# Patient Record
Sex: Male | Born: 1945 | Race: White | Hispanic: No | Marital: Married | Smoking: Never smoker
Health system: Southern US, Community
[De-identification: ages and names within clinical notes are randomized; demographics above are authoritative.]

## PROBLEM LIST (undated history)

## (undated) DIAGNOSIS — H3552 Pigmentary retinal dystrophy: Secondary | ICD-10-CM

## (undated) DIAGNOSIS — H9190 Unspecified hearing loss, unspecified ear: Secondary | ICD-10-CM

## (undated) DIAGNOSIS — I10 Essential (primary) hypertension: Secondary | ICD-10-CM

## (undated) HISTORY — DX: Essential (primary) hypertension: I10

## (undated) HISTORY — PX: COLONOSCOPY: SHX174

## (undated) HISTORY — PX: EYE SURGERY: SHX253

## (undated) HISTORY — PX: TONSILLECTOMY: SUR1361

## (undated) HISTORY — DX: Unspecified hearing loss, unspecified ear: H91.90

## (undated) HISTORY — PX: BRAIN SURGERY: SHX531

## (undated) HISTORY — DX: Pigmentary retinal dystrophy: H35.52

---

## 2008-05-06 ENCOUNTER — Ambulatory Visit: Payer: Self-pay | Admitting: Gastroenterology

## 2008-12-09 ENCOUNTER — Ambulatory Visit: Payer: Self-pay | Admitting: Internal Medicine

## 2011-10-13 ENCOUNTER — Telehealth: Payer: Self-pay | Admitting: *Deleted

## 2011-10-13 ENCOUNTER — Encounter: Payer: Self-pay | Admitting: Vascular Surgery

## 2011-10-13 NOTE — Telephone Encounter (Signed)
Spoke with Mrs. Hoffmaster regarding her husband's medical history as he has decreased hearing in preparation for office visit with Dr. Hart Rochester on 10-17-2011.  Rankin, Neena Rhymes

## 2011-10-14 ENCOUNTER — Other Ambulatory Visit: Payer: Self-pay

## 2011-10-14 DIAGNOSIS — I83893 Varicose veins of bilateral lower extremities with other complications: Secondary | ICD-10-CM

## 2011-10-17 ENCOUNTER — Encounter: Payer: Self-pay | Admitting: Vascular Surgery

## 2011-10-17 ENCOUNTER — Other Ambulatory Visit (INDEPENDENT_AMBULATORY_CARE_PROVIDER_SITE_OTHER): Payer: BC Managed Care – PPO

## 2011-10-17 ENCOUNTER — Ambulatory Visit (INDEPENDENT_AMBULATORY_CARE_PROVIDER_SITE_OTHER): Payer: BC Managed Care – PPO | Admitting: Vascular Surgery

## 2011-10-17 VITALS — BP 138/88 | HR 69 | Resp 16 | Ht 73.0 in | Wt 209.4 lb

## 2011-10-17 DIAGNOSIS — I83893 Varicose veins of bilateral lower extremities with other complications: Secondary | ICD-10-CM

## 2011-10-17 NOTE — Progress Notes (Addendum)
Subjective:     Patient ID: Tyrone Ortega, male   DOB: [birth year], 65 y.o.   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 These have become increasingly uncomfortable with aching throbbing and burning. He has had chronic swelling in the right ankle. He does not wear elastic stockings. He does not elevate his leg on a regular basis. He has no history of stasis ulcers, DVT, thrombophlebitis, or bleeding.His symptoms are worsening however.  Review of Systems denies chest pain dyspnea on exertion PND orthopnea or hemoptysis. Does complain of dizziness, headaches, arthritis, joint pain, urinary frequency. All other systems are negative and a complete review of systems  Past Medical History  Diagnosis Date  . Decreased hearing   . RP (retinitis pigmentosa)   . Hypertension     History  Substance Use Topics  . Smoking status: Never Smoker   . Smokeless tobacco: Not on file  . Alcohol Use: No    Family History  Problem Relation Age of Onset  . Kidney disease Mother   . Heart attack Father   . Heart disease Father   . Cancer Sister     breast  . Other Brother     mental retardation    No Known Allergies  Current outpatient prescriptions:latanoprost (XALATAN) 0.005 % ophthalmic solution, Place 1 drop into both eyes at bedtime. , Disp: , Rfl: ;  LISINOPRIL PO, Take 40 mg by mouth daily. , Disp: , Rfl:   BP 138/88  Pulse 69  Resp 16  Ht 6\' 1"  (1.854 m)  Wt 209 lb 6.4 oz (94.983 kg)  BMI 27.63 kg/m2  Body mass index is 27.63 kg/(m^2).          Objective:   Physical Exam general he is a well-developed well-nourished male in no apparent distress Blood pressure 138/88 heart rate 69 respirations 16 HEENT normal for age Chest no rhonchi or wheezing Cardiovascular regular rhythm no murmurs Abdomen soft nontender no masses Neurologic normal Musculoskeletal free of major deformities Skin no rashes. He does have  bulging varicosities which are quite large in the right posterior and medial calf over the great and small saphenous system. There is 1-2+ edema in the right ankle. There is some early hyperpigmentation in the right ankle. He has 3+ femoral popliteal and dorsalis pedis pulses palpable bilaterally.  Today I ordered a venous duplex exam which I reviewed and interpreted. The right great saphenous vein has reflux particularly in the mid calf to the thigh area with a large caliber vein. As communicates with the varicosities in the right medial calf. There is a large right small saphenous vein which has reflux at the junction. It does not appear to be a straight line vein however down to the mid calf area on my independent exam. There is also severe reflux in the right popliteal vein in the deep system but no DVT     Assessment:    severe venous insufficiency right leg with gross reflux in right GSV and small saphenous vein.    Plan:    #1 lumbar elastic compression-20-30 mm gradient #2 elevate legs when possible #3 ibuprofen on a daily basis #4 return in 3 months if no improvement I believe we should proceed with laser ablation of right great saphenous vein with multiple stab phlebectomy. Will follow right small saphenous vein which is a quite large vein. This may need open surgical treatment.

## 2011-10-18 NOTE — Procedures (Unsigned)
LOWER EXTREMITY VENOUS REFLUX EXAM  INDICATION:  Lower extremity varicose veins with pain and inflammation.  EXAM:  Using color-flow imaging and pulse Doppler spectral analysis, the bilateral common femoral, superficial femoral, popliteal, posterior tibial, greater and lesser saphenous veins are evaluated.  There is evidence suggesting deep venous insufficiency in the bilateral common femoral veins and the right popliteal vein.  The bilateral saphenofemoral junction is competent. The bilateral GSV's are not competent with Reflux of >569milliseconds with the caliber as described below.   The right proximal small saphenous vein demonstrates incompetency, measuring 1.1 cm at the saphenopopliteal junction and 1.45 cm in the proximal calf.  GSV Diameter (used if found to be incompetent only)                                           Right    Left Proximal Greater Saphenous Vein           0.68 cm  1.14 cm Proximal-to-mid-thigh                     0.62 cm  0.68 cm Mid thigh                                 0.52 cm  0.55 cm Mid-distal thigh                          cm       cm Distal thigh                              0.49 cm  0.49 cm Knee                                      0.83 cm  0.52 cm  PROXIMAL CALF RIGHT:  0.71 cm.  PROXIMAL CALF LEFT:  0.41 cm  IMPRESSION: 1. Bilaterally, the great saphenous veins are not competent with     reflux >555milliseconds. 2. The bilateral great saphenous vein is not tortuous. 3. The deep venous system is not competent with reflux >500     milliseconds. 4. The right small saphenous vein is not competent with reflux >500     milliseconds. 5. Bilaterally, there is a complex moderately sized Baker's cyst.  ___________________________________________ Quita Skye. Hart Rochester, M.D.  CI/MEDQ  D:  10/17/2011  T:  10/17/2011  Job:  [id number]

## 2011-12-12 ENCOUNTER — Ambulatory Visit: Payer: Self-pay | Admitting: Internal Medicine

## 2011-12-12 ENCOUNTER — Emergency Department: Payer: Self-pay | Admitting: *Deleted

## 2011-12-12 ENCOUNTER — Ambulatory Visit: Payer: Self-pay | Admitting: Oncology

## 2011-12-13 ENCOUNTER — Ambulatory Visit: Payer: Self-pay | Admitting: Oncology

## 2011-12-27 ENCOUNTER — Ambulatory Visit: Payer: Self-pay | Admitting: Oncology

## 2012-01-17 ENCOUNTER — Ambulatory Visit: Payer: BC Managed Care – PPO | Admitting: Vascular Surgery

## 2012-01-20 DIAGNOSIS — D1802 Hemangioma of intracranial structures: Secondary | ICD-10-CM

## 2012-01-20 DIAGNOSIS — I10 Essential (primary) hypertension: Secondary | ICD-10-CM

## 2012-05-28 ENCOUNTER — Ambulatory Visit: Payer: BC Managed Care – PPO | Admitting: Family Medicine

## 2012-05-29 IMAGING — CT CT HEAD WITHOUT AND WITH CONTRAST
1 of 2 series · 13 of 30 positions shown, 17 images · non-contrast
Comparison: none

REASON FOR EXAM: hx of falls dizziness headaches  eval mass or tumor
COMMENTS:

PROCEDURE:     CT  - CT HEAD W/WO  - December 12, 2011  [DATE]
RESULT:     Comparison:  None
INDICATION: Headaches.
TECHNIQUE: Multiple axial images were obtained prior to following 50 mL of
6sovue-7OO IV contrast.

[Series 2: without · axial · non-contrast · 0.40mm/px · z∈[-189,-59]mm · 13 of 32 slices shown, 17 images]
[im 3/32  brain]
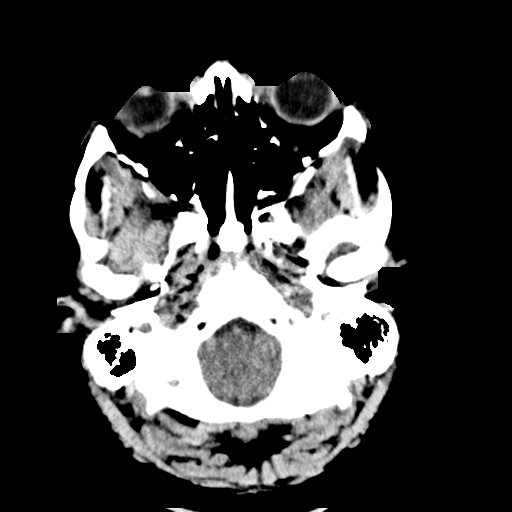
[im 3/32  bone]
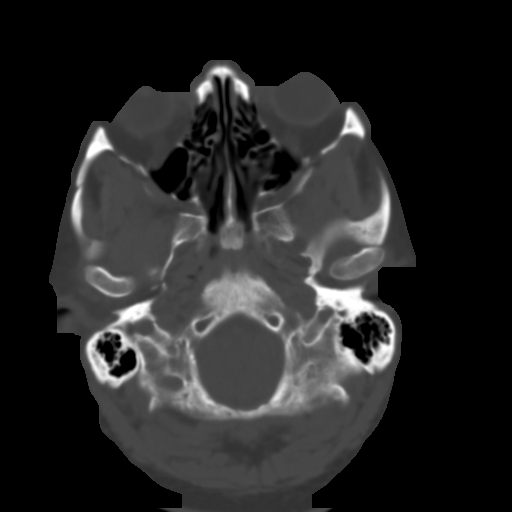
[im 5/32  brain]
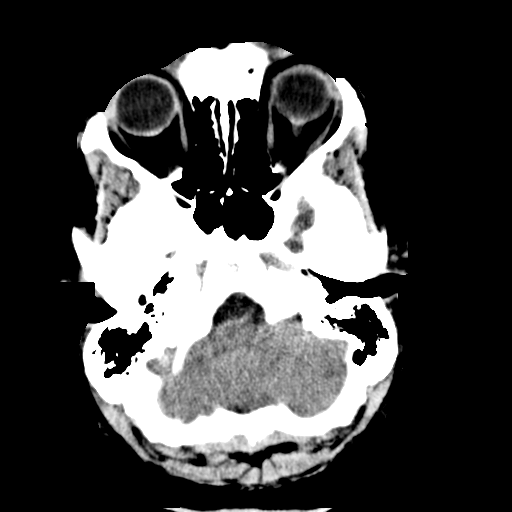
[im 7/32  brain]
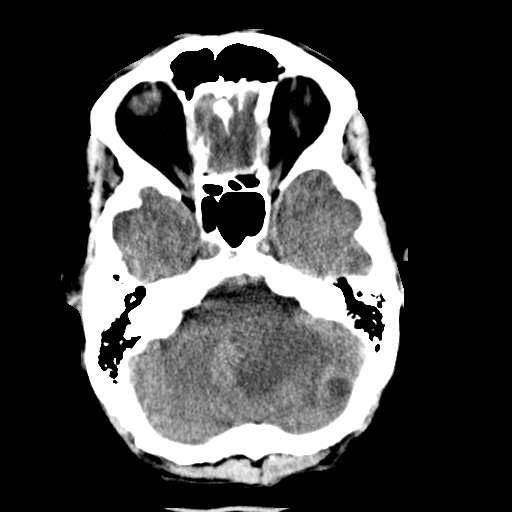
[im 9/32  brain]
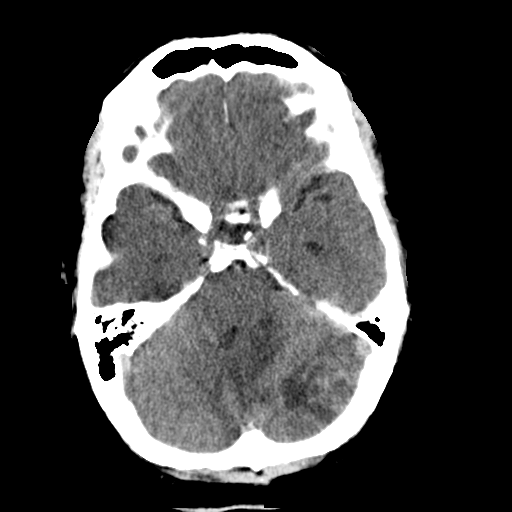
[im 12/32  brain]
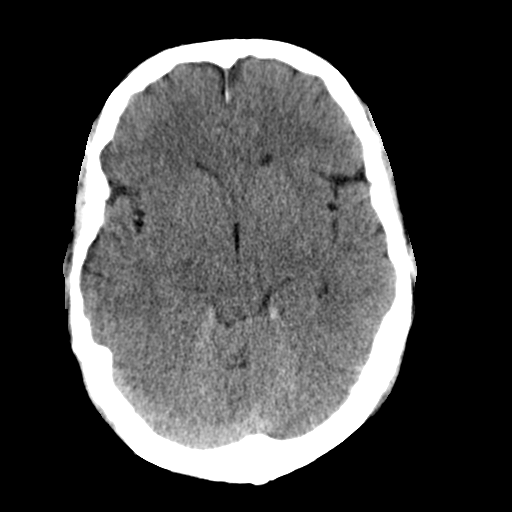
[im 12/32  bone]
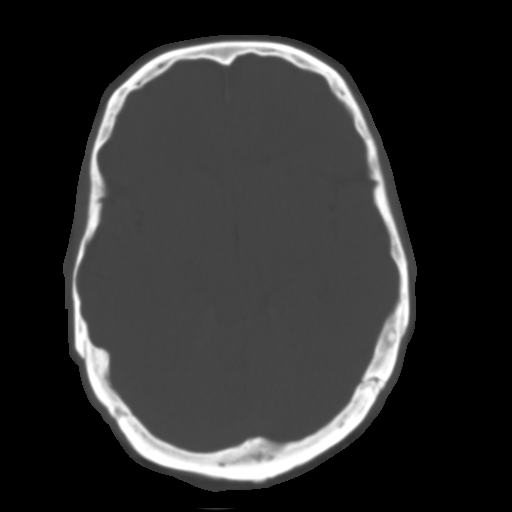
[im 14/32  brain]
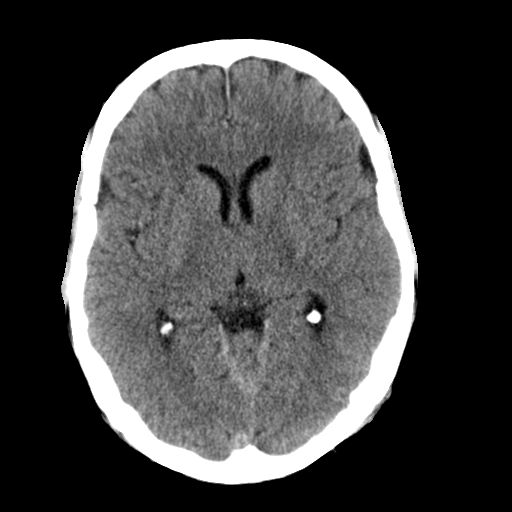
[im 16/32  brain]
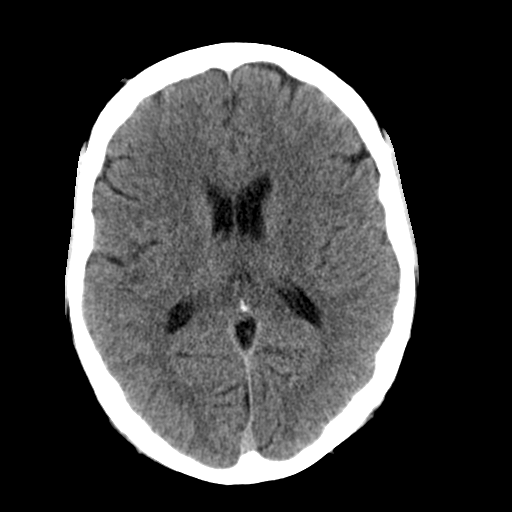
[im 18/32  brain]
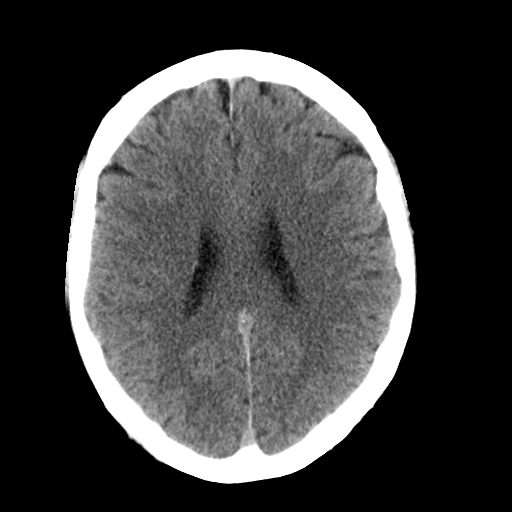
[im 20/32  brain]
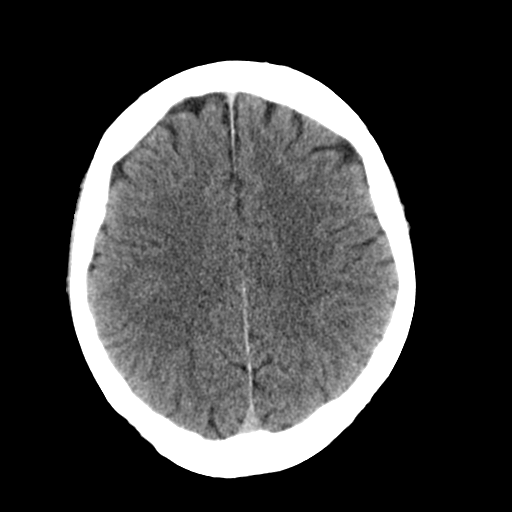
[im 20/32  bone]
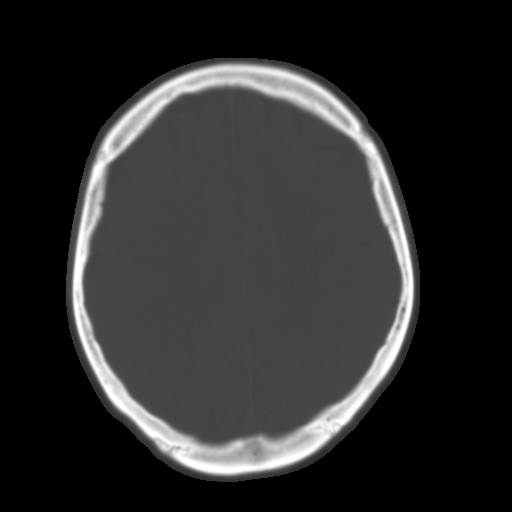
[im 23/32  brain]
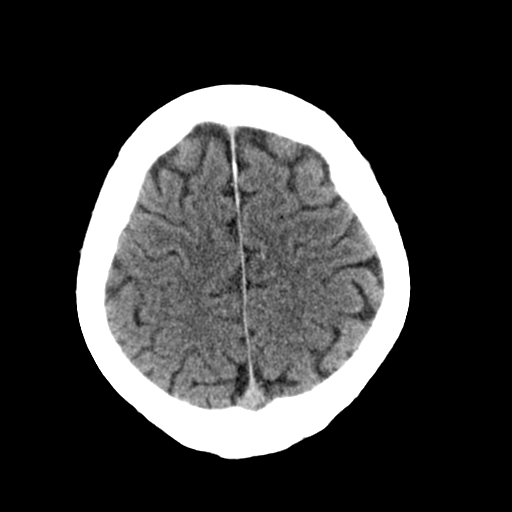
[im 25/32  brain]
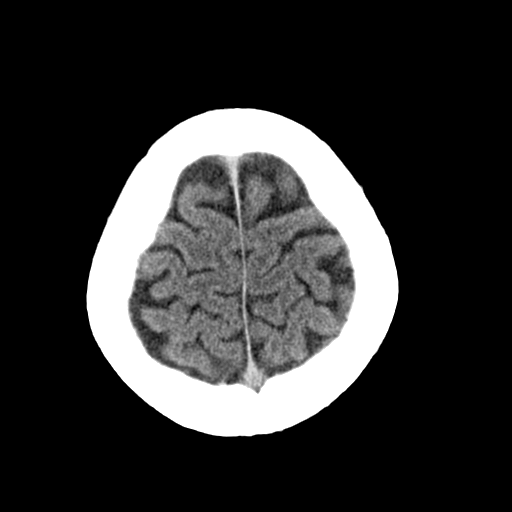
[im 27/32  brain]
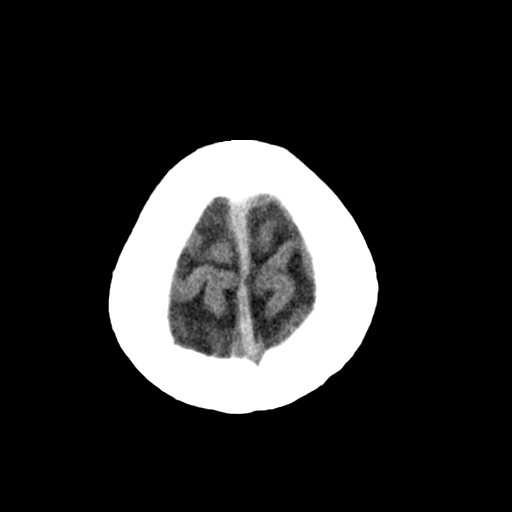
[im 29/32  brain]
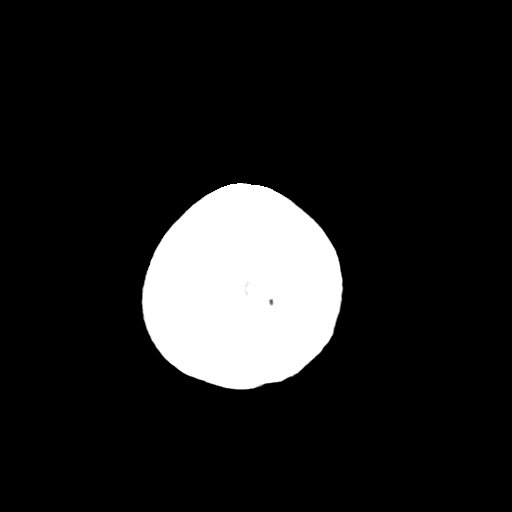
[im 29/32  bone]
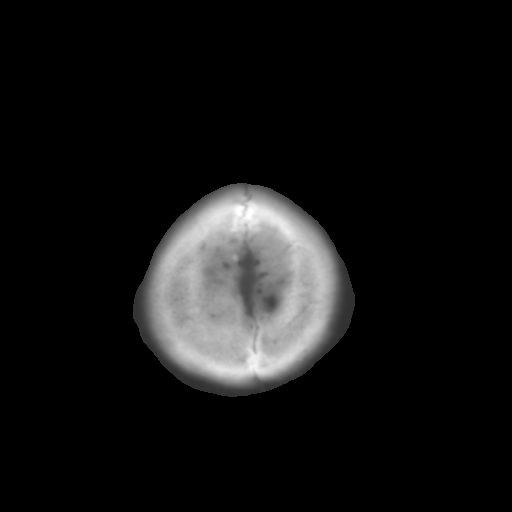

[13 of 30 positions shown; findings below may reference images not displayed]

FINDINGS: There is a 3.3 x 2.5 cm complex cystic mass in the left cerebellar
hemisphere with a soft tissue enhancing component measuring 2.2 cm. There is
surrounding vasogenic edema. The fourth ventricle is partially effaced and
displaced towards the right. There is no other soft tissue mass.

The basal cisterns are effaced. There is no evidence for cortical-based area
of infarction.  There is no hydrocephalus at this time.

Visualized portions of the orbits and paranasal sinuses are unremarkable.
Osseous structures are negative for fracture, lytic, or blastic lesions.
IMPRESSION: 1. Complex cystic left cerebellar mass most concerning for malignancy,
metastatic versus primary.

## 2012-06-01 IMAGING — CT CT CHEST-ABD-PELV W/ CM
1 of 2 series · 15 of 32 positions shown, 19 images · non-contrast
Comparison: none

REASON FOR EXAM: brain lesion assess for primary
COMMENTS:

PROCEDURE:     KCT - KCT CHEST ABDOMEN AND PELVIS W  - December 15, 2011  [DATE]
RESULT:     Comparison: None
TECHNIQUE: Multiple axial images obtained from the thoracic inlet to the
pubic symphysis, with p.o. contrast and with 100 ml of Csovue-ZW5
intravenous contrast.

[Series 2: ch-ab-pel w 5.0 i40f 3 · axial · 0.85mm/px · z∈[-196,+404]mm · 15 of 133 slices shown, 19 images]
[im 7/133  soft-tissue]
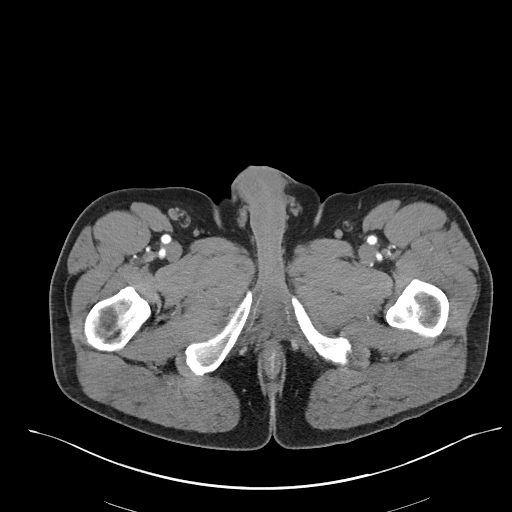
[im 7/133  bone]
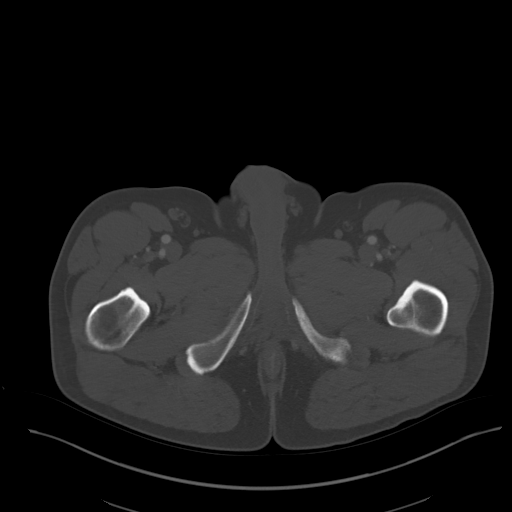
[im 19/133  soft-tissue]
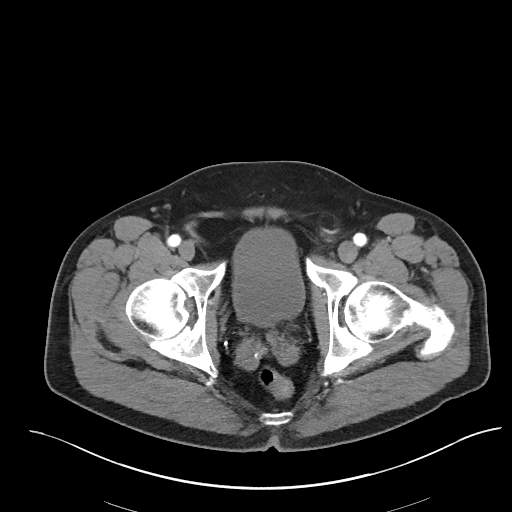
[im 31/133  soft-tissue]
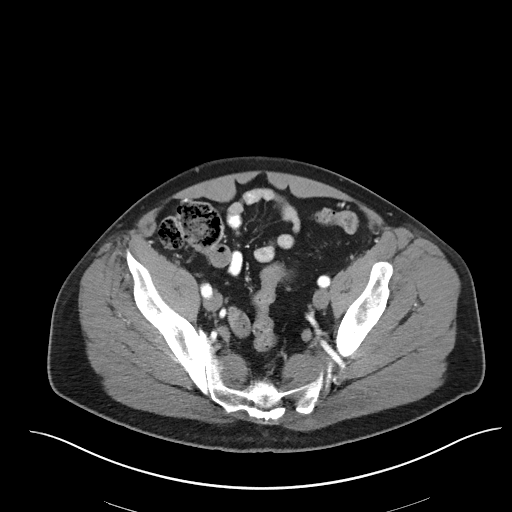
[im 37/133  soft-tissue]
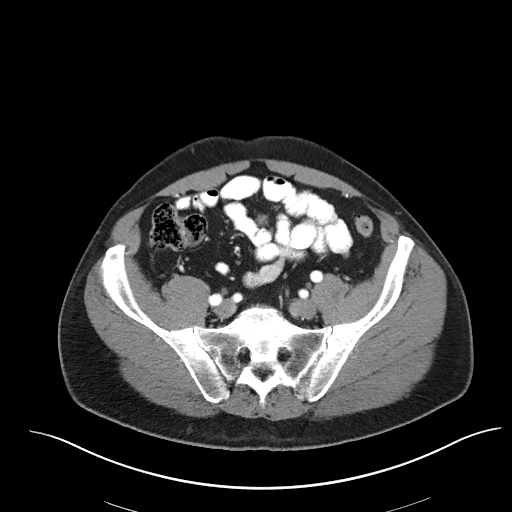
[im 49/133  soft-tissue]
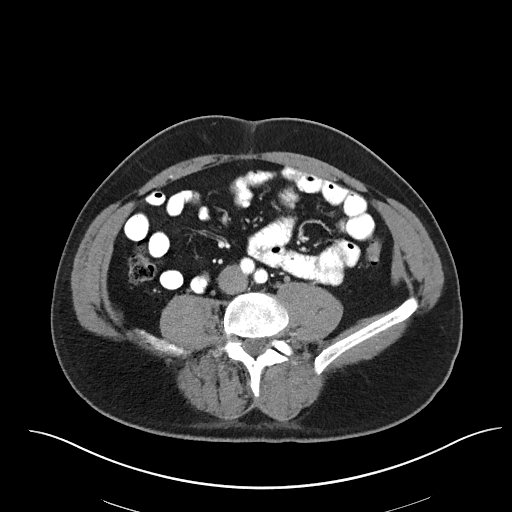
[im 55/133  soft-tissue]
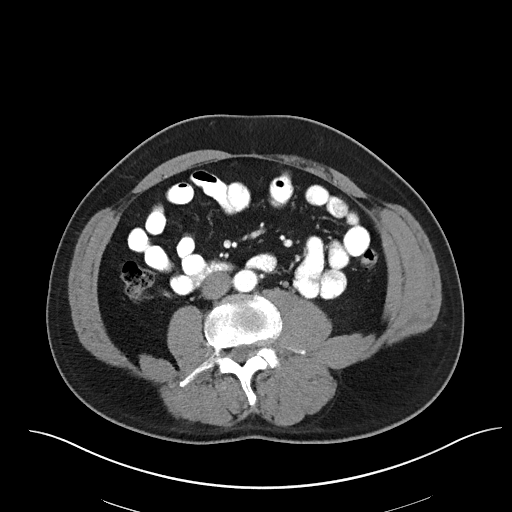
[im 67/133  soft-tissue]
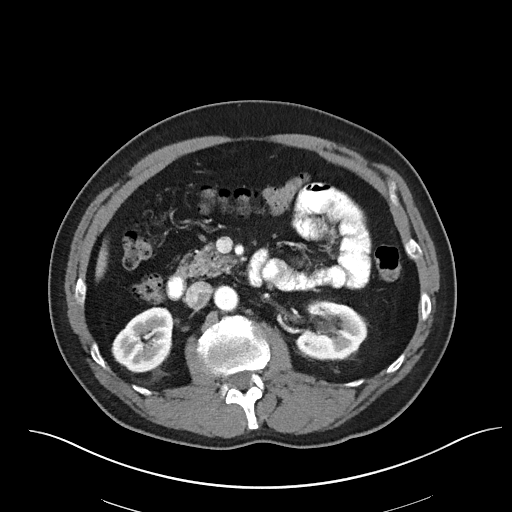
[im 79/133  soft-tissue]
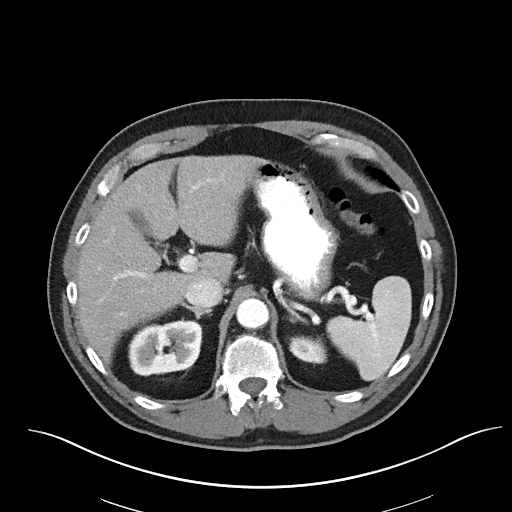
[im 85/133  soft-tissue]
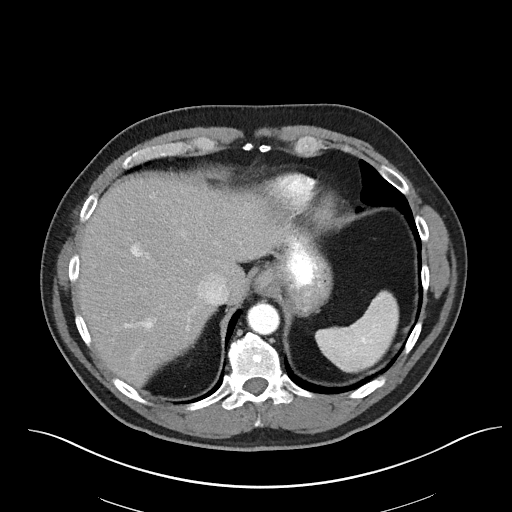
[im 85/133  bone]
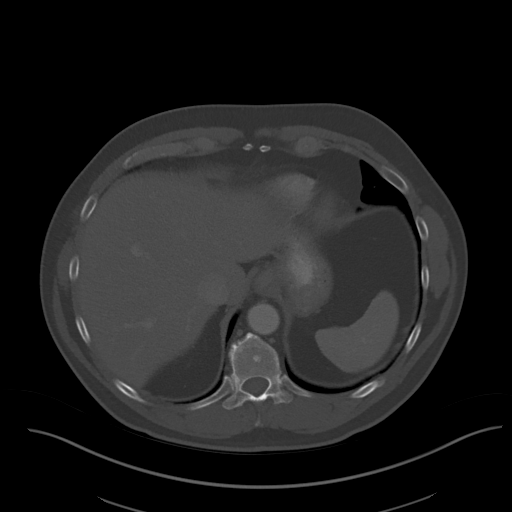
[im 97/133  soft-tissue]
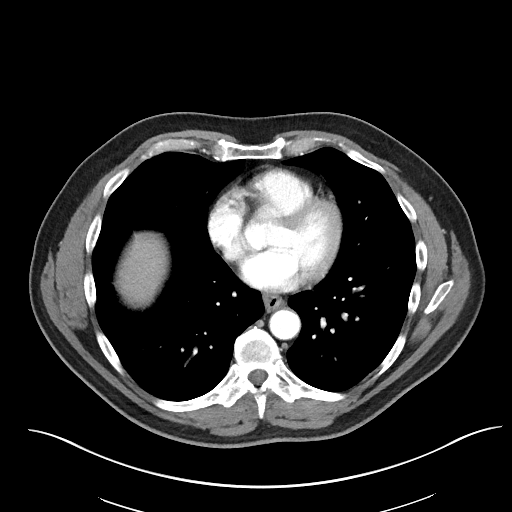
[im 103/133  soft-tissue]
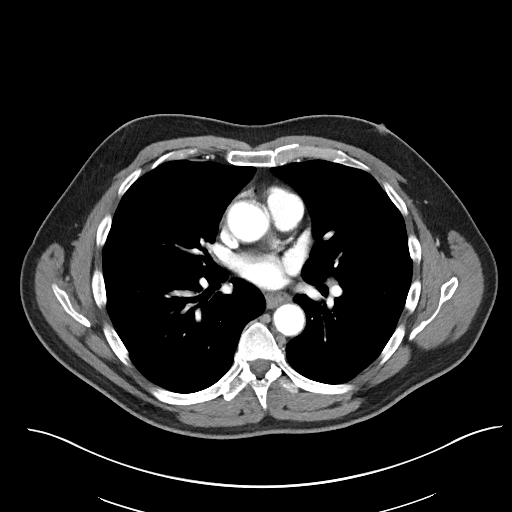
[im 109/133  lung]
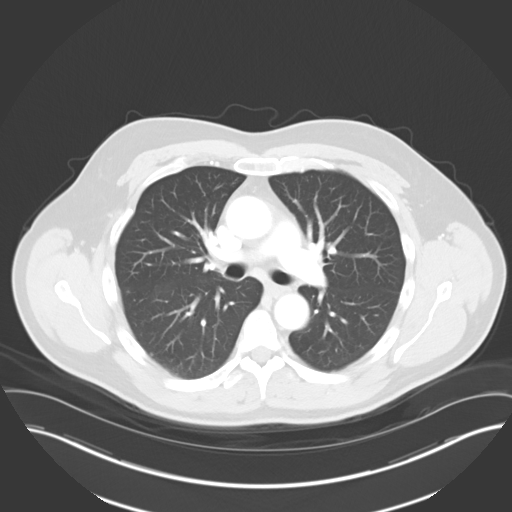
[im 115/133  soft-tissue]
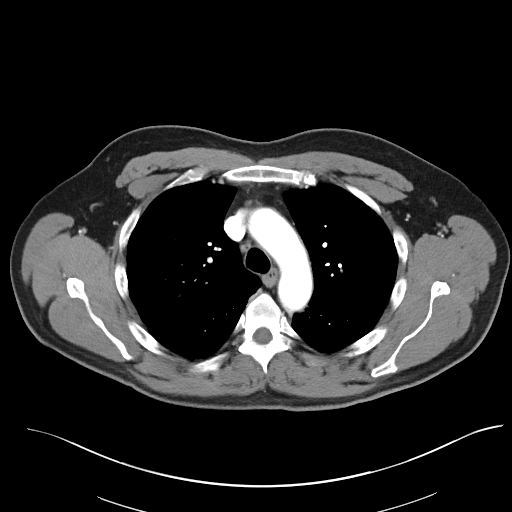
[im 115/133  lung]
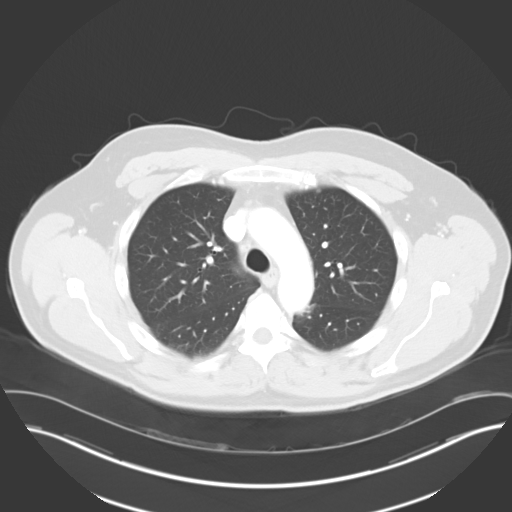
[im 121/133  lung]
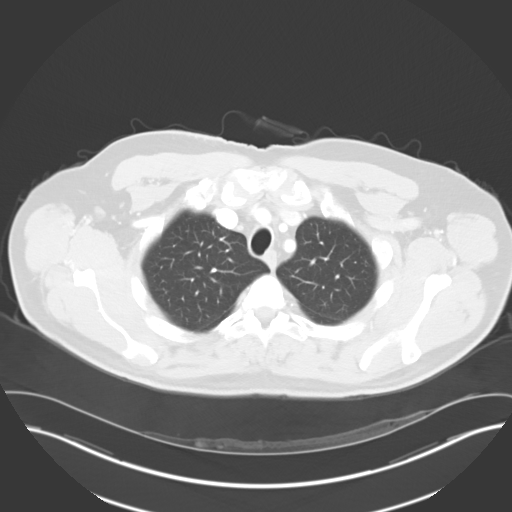
[im 127/133  soft-tissue]
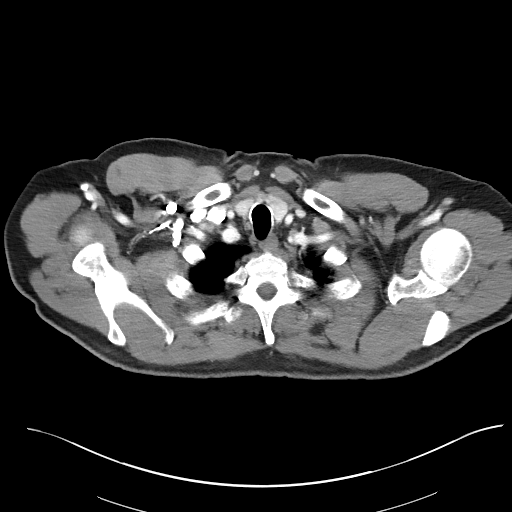
[im 127/133  lung]
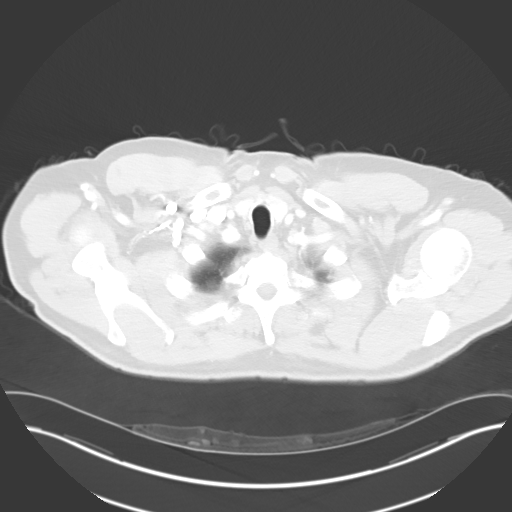

[15 of 32 positions shown; findings below may reference images not displayed]

FINDINGS: No mediastinal, hilar, or axillary lymphadenopathy. There is a 3 mm nodule
in the inferior right lower lobe, image 31. There are a few tiny calcified
nodules in the lungs, which are likely sequela of old prior infection.

The liver, gallbladder, spleen, adrenals, and pancreas are unremarkable. The
kidneys enhance normally.

No retroperitoneal or mesenteric lymphadenopathy. The small and large bowel
are normal in caliber. There is mild diverticulosis of the sigmoid colon.
The appendix is normal.

There is an old left posterior lateral tenth rib fracture. No aggressive
lytic or sclerotic osseous lesions are identified.
IMPRESSION: 1. No mass identified within the chest, abdomen, or pelvis.
2. Indeterminate 3 mm nodule in the right middle lobe.

## 2012-07-13 ENCOUNTER — Encounter: Payer: Self-pay | Admitting: Vascular Surgery

## 2012-07-16 ENCOUNTER — Encounter: Payer: Self-pay | Admitting: Vascular Surgery

## 2012-07-16 ENCOUNTER — Ambulatory Visit (INDEPENDENT_AMBULATORY_CARE_PROVIDER_SITE_OTHER): Payer: BC Managed Care – PPO | Admitting: Vascular Surgery

## 2012-07-16 VITALS — BP 130/88 | HR 85 | Resp 20 | Ht 73.0 in | Wt 210.0 lb

## 2012-07-16 DIAGNOSIS — I83893 Varicose veins of bilateral lower extremities with other complications: Secondary | ICD-10-CM | POA: Insufficient documentation

## 2012-07-16 NOTE — Progress Notes (Signed)
Subjective:     Patient ID: Tyrone Ortega, male   DOB: 1946/07/24, 66 y.o.   MRN: [id number]  HPI this 66 year old male returns for further evaluation of his severe varicose veins in the right leg. I evaluated him in October of 2012 at that time he was found to have gross reflux in the right great saphenous and right small saphenous veins with bulging varicosities. He was having aching throbbing and burning discomfort. He has been wearing long leg elastic compression stockings-20-30 mm gradient as well as trying elevation and ibuprofen without success. He also continues to have significant edema in the right calf and ankle with darkening of the skin. 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 His symptoms are continuing to worsen in the right leg and affecting his daily living.  Past Medical History  Diagnosis Date  . Decreased hearing   . RP (retinitis pigmentosa)   . Hypertension     History  Substance Use Topics  . Smoking status: Never Smoker   . Smokeless tobacco: Former Neurosurgeon    Quit date: 07/16/2004  . Alcohol Use: No    Family History  Problem Relation Age of Onset  . Kidney disease Mother   . Heart attack Father   . Heart disease Father   . Cancer Sister     breast  . Other Brother     mental retardation    No Known Allergies  Current outpatient prescriptions:latanoprost (XALATAN) 0.005 % ophthalmic solution, Place 1 drop into both eyes at bedtime. , Disp: , Rfl: ;  LISINOPRIL PO, Take 40 mg by mouth daily. , Disp: , Rfl:   BP 130/88  Pulse 85  Resp 20  Ht 6\' 1"  (1.854 m)  Wt 210 lb (95.255 kg)  BMI 27.71 kg/m2  Body mass index is 27.71 kg/(m^2).          Review of Systems denies chest pain, dyspnea on exertion, PND, orthopnea, hemoptysis, claudication     Objective:   Physical Exam blood pressure 130/88 heart rate 85 respirations 20 General well-developed well-nourished male in no apparent  distress alert and oriented x3 Lungs no rhonchi or wheezing Lower extremities 3+ femoral and posterior tibial pulses palpable bilaterally. Right leg has bulging varicosities along the greater and lesser saphenous distributions beginning in the distal thigh extending into the medial and posterior calf and down to the ankle. There is mild hyperpigmentation with 1-2+ edema.     Assessment:     This patient has a large bulging varicosities with edema and symptoms which are not responding to conservative measures and are affecting his daily living and increasing manner. He has documented gross reflux in the right great and small saphenous systems    Plan:     #1 laser ablation right great saphenous vein #2 laser ablation right small saphenous vein with greater than 20 stab phlebectomy Will proceed with precertification to perform this in the near future to relieve this nice patient's symptoms

## 2012-08-10 ENCOUNTER — Encounter: Payer: Self-pay | Admitting: Vascular Surgery

## 2012-08-13 ENCOUNTER — Ambulatory Visit (INDEPENDENT_AMBULATORY_CARE_PROVIDER_SITE_OTHER): Payer: BC Managed Care – PPO | Admitting: Vascular Surgery

## 2012-08-13 ENCOUNTER — Encounter: Payer: Self-pay | Admitting: Vascular Surgery

## 2012-08-13 ENCOUNTER — Other Ambulatory Visit: Payer: Self-pay | Admitting: *Deleted

## 2012-08-13 VITALS — BP 151/90 | HR 73 | Resp 16 | Ht 73.0 in | Wt 210.0 lb

## 2012-08-13 DIAGNOSIS — I83893 Varicose veins of bilateral lower extremities with other complications: Secondary | ICD-10-CM

## 2012-08-13 NOTE — Progress Notes (Signed)
Subjective:     Patient ID: Tyrone Ortega, male   DOB: 12-21-1946, 66 y.o.   MRN: [id number]  HPI this 66 year old male had laser ablation of the right great saphenous vein performed from mid calf to the saphenofemoral junction under local tumescent anesthesia for painful varicosities do to reflux. He tolerated the surgery well. A total of 2435 J of energy was utilized.   Review of Systems     Objective:   Physical ExamBP 151/90  Pulse 73  Resp 16  Ht 6\' 1"  (1.854 m)  Wt 210 lb (95.255 kg)  BMI 27.71 kg/m2       Assessment:     Well-tolerated laser ablation right great saphenous vein from mid calf to saphenofemoral junction under local tumescent anesthesia    Plan:     Return August 26 for venous duplex exam to confirm closure right great saphenous vein. We'll then schedule for similar procedure of right small saphenous vein plus stab phlebectomy

## 2012-08-13 NOTE — Progress Notes (Signed)
Laser Ablation Procedure      Date: 08/13/2012    Tyrone Ortega DOB:1946-04-01  Consent signed: Yes  Surgeon:J.D. Hart Rochester  Procedure: Laser Ablation: right Greater Saphenous Vein  BP 151/90  Pulse 73  Resp 16  Ht 6\' 1"  (1.854 m)  Wt 210 lb (95.255 kg)  BMI 27.71 kg/m2  Start time: 9:05   End time: 9:50  Tumescent Anesthesia: 450 cc 0.9% NaCl with 50 cc Lidocaine HCL with 1% Epi and 15 cc 8.4% NaHCO3  Local Anesthesia: 5 cc Lidocaine HCL and NaHCO3 (ratio 2:1)  Pulsed mode: Watts 15 Seconds 1 Pulses:1 Total Pulses:163 Total Energy: 2436 Total Time: 2:42     Patient tolerated procedure well: Yes  Notes:   Description of Procedure:  After marking the course of the saphenous vein and the secondary varicosities in the standing position, the patient was placed on the operating table in the supine position, and the right leg was prepped and draped in sterile fashion. Local anesthetic was administered, and under ultrasound guidance the saphenous vein was accessed with a micro needle and guide wire; then the micro puncture sheath was placed. A guide wire was inserted to the saphenofemoral junction, followed by a 5 french sheath.  The position of the sheath and then the laser fiber below the junction was confirmed using the ultrasound and visualization of the aiming beam.  Tumescent anesthesia was administered along the course of the saphenous vein using ultrasound guidance. Protective laser glasses were placed on the patient, and the laser was fired at 15 watt pulsed mode advancing 1-2 mm per sec.  For a total of 2436 joules.  A steri strip was applied to the puncture site.    ABD pads and thigh high compression stockings were applied.  Ace wrap bandages were applied over the phlebectomy sites and at the top of the saphenofemoral junction.  Blood loss was less than 15 cc.  The patient ambulated out of the operating room having tolerated the procedure well.

## 2012-08-14 ENCOUNTER — Telehealth: Payer: Self-pay | Admitting: *Deleted

## 2012-08-14 ENCOUNTER — Encounter: Payer: Self-pay | Admitting: Vascular Surgery

## 2012-08-14 NOTE — Telephone Encounter (Signed)
Patient doing well. No problems or pain. Reminded him of his fu appt. Next week.

## 2012-08-17 ENCOUNTER — Encounter: Payer: Self-pay | Admitting: Vascular Surgery

## 2012-08-20 ENCOUNTER — Encounter: Payer: Self-pay | Admitting: Vascular Surgery

## 2012-08-20 ENCOUNTER — Ambulatory Visit (INDEPENDENT_AMBULATORY_CARE_PROVIDER_SITE_OTHER): Payer: BC Managed Care – PPO | Admitting: Vascular Surgery

## 2012-08-20 ENCOUNTER — Encounter (INDEPENDENT_AMBULATORY_CARE_PROVIDER_SITE_OTHER): Payer: BC Managed Care – PPO | Admitting: *Deleted

## 2012-08-20 VITALS — BP 154/91 | HR 66 | Resp 18 | Ht 73.0 in | Wt 210.0 lb

## 2012-08-20 DIAGNOSIS — I83893 Varicose veins of bilateral lower extremities with other complications: Secondary | ICD-10-CM

## 2012-08-20 NOTE — Progress Notes (Signed)
Subjective:     Patient ID: Tyrone Ortega, male   DOB: 16-Sep-1946, 66 y.o.   MRN: [id number]  HPI this 66 year old male returns today 1 week post laser ablation right great saphenous vein for painful varicosities. He has had mild to moderate discomfort along the course of the great saphenous vein. He has noticed decreased distal edema. He has been wearing his elastic compression stockings and taking ibuprofen as instructed.  Past Medical History  Diagnosis Date  . Decreased hearing   . RP (retinitis pigmentosa)   . Hypertension     History  Substance Use Topics  . Smoking status: Never Smoker   . Smokeless tobacco: Former Neurosurgeon    Quit date: 07/16/2004  . Alcohol Use: No    Family History  Problem Relation Age of Onset  . Kidney disease Mother   . Heart attack Father   . Heart disease Father   . Cancer Sister     breast  . Other Brother     mental retardation    No Known Allergies  Current outpatient prescriptions:latanoprost (XALATAN) 0.005 % ophthalmic solution, Place 1 drop into both eyes at bedtime. , Disp: , Rfl: ;  LISINOPRIL PO, Take 40 mg by mouth daily. , Disp: , Rfl:   BP 154/91  Pulse 66  Resp 18  Ht 6\' 1"  (1.854 m)  Wt 210 lb (95.255 kg)  BMI 27.71 kg/m2  Body mass index is 27.71 kg/(m^2).           Review of Systems denies chest pain, dyspnea on exertion, PND, orthopnea, hemoptysis, claudication to    Objective:   Physical Exam blood pressure 150/91 heart rate 66 respirations 18 General well-developed well-nourished male in no apparent stress alert and oriented x3 Lungs no rhonchi or wheezing Lower extremities right leg with mild discomfort along the course of the great saphenous vein from the saphenofemoral junction to the proximal calf. There is 1+ chronic edema. Also noted are bulging varicosities most of which are thrombosed on physical exam.   today I ordered a venous duplex exam of the right leg show reviewed and in appears totally occluded  with most varicosities being totally occluded. There is no DVT.terpreted. The great saphenous vein is totally occluded    successful laser ablation right great saphenous vein performed under local tumescent anesthesia with thrombosis of most secondary varicosities and residual right small saphenous vein occluded from the saphenofemoral junction to the proximal calf. The small saphenous vein now totally occluded    Assessment:         Plan:       return in 3 months for formal venous duplex exam right leg to check status of great and small saphenous vein and to see if stab phlebectomy is necessary

## 2012-11-12 ENCOUNTER — Other Ambulatory Visit: Payer: Self-pay | Admitting: *Deleted

## 2012-11-12 DIAGNOSIS — I83893 Varicose veins of bilateral lower extremities with other complications: Secondary | ICD-10-CM

## 2012-11-19 ENCOUNTER — Encounter: Payer: Self-pay | Admitting: Vascular Surgery

## 2012-11-20 ENCOUNTER — Ambulatory Visit (INDEPENDENT_AMBULATORY_CARE_PROVIDER_SITE_OTHER): Payer: BC Managed Care – PPO | Admitting: Vascular Surgery

## 2012-11-20 ENCOUNTER — Encounter (INDEPENDENT_AMBULATORY_CARE_PROVIDER_SITE_OTHER): Payer: BC Managed Care – PPO | Admitting: *Deleted

## 2012-11-20 ENCOUNTER — Encounter: Payer: Self-pay | Admitting: Vascular Surgery

## 2012-11-20 VITALS — BP 128/86 | HR 73 | Resp 16 | Ht 73.0 in | Wt 210.0 lb

## 2012-11-20 DIAGNOSIS — I83893 Varicose veins of bilateral lower extremities with other complications: Secondary | ICD-10-CM

## 2012-11-20 DIAGNOSIS — Z48812 Encounter for surgical aftercare following surgery on the circulatory system: Secondary | ICD-10-CM

## 2012-11-20 NOTE — Progress Notes (Signed)
Subjective:     Patient ID: Tyrone Ortega, male   DOB: 08/26/1946, 66 y.o.   MRN: [id number]  HPI this 66 year old male returns 3 months post laser ablation right great saphenous vein for painful varicosities. He states he has had no edema in the right foot and ankle area and has not needed to wear elastic compression stockings. He does have a thrombosed varicosity which occurred following his laser ablation procedure and this seems to be getting smaller. He denies any significant pain. He has had no ulcerations or bleeding.  Past Medical History  Diagnosis Date  . Decreased hearing   . RP (retinitis pigmentosa)   . Hypertension     History  Substance Use Topics  . Smoking status: Never Smoker   . Smokeless tobacco: Former Neurosurgeon    Quit date: 07/16/2004  . Alcohol Use: No    Family History  Problem Relation Age of Onset  . Kidney disease Mother   . Heart attack Father   . Heart disease Father   . Cancer Sister     breast  . Other Brother     mental retardation    No Known Allergies  Current outpatient prescriptions:latanoprost (XALATAN) 0.005 % ophthalmic solution, Place 1 drop into both eyes at bedtime. , Disp: , Rfl: ;  LISINOPRIL PO, Take 40 mg by mouth daily. , Disp: , Rfl:   BP 128/86  Pulse 73  Resp 16  Ht 6\' 1"  (1.854 m)  Wt 210 lb (95.255 kg)  BMI 27.71 kg/m2  Body mass index is 27.71 kg/(m^2).           Review of Systems denies chest pain, dyspnea on exertion, PND, orthopnea, hemoptysis, claudication     Objective:   Physical Exam blood pressure 120/86 heart rate 73 respirations 16 General well-developed well-nourished male in no apparent stress alert and oriented x3 Lungs no rhonchi or wheezing Right lower extremity with 3+ femoral and dorsalis pedis pulse palpable. No distal edema noted. 2 small thrombosed varicosities in the proximal medial calf which are nontender. No hyperpigmentation or ulceration is noted.  Today I ordered a venous duplex  exam of the right leg which are reviewed and interpreted. The right great saphenous vein is totally occluded up to a point near the saphenofemoral junction. The right small saphenous vein has some reflux near its origin and there are some thrombosed varicosities originating from this. There is no DVT.     Assessment:     Successful laser ablation right great saphenous vein for venous insufficiency due to reflux-asymptomatic at present time    Plan:     Return to see [place] on when necessary basis

## 2013-06-17 ENCOUNTER — Other Ambulatory Visit: Payer: Self-pay | Admitting: *Deleted

## 2013-06-17 DIAGNOSIS — R230 Cyanosis: Secondary | ICD-10-CM

## 2013-06-21 ENCOUNTER — Encounter: Payer: Self-pay | Admitting: Vascular Surgery

## 2013-06-24 ENCOUNTER — Ambulatory Visit (INDEPENDENT_AMBULATORY_CARE_PROVIDER_SITE_OTHER): Payer: BC Managed Care – PPO | Admitting: Vascular Surgery

## 2013-06-24 ENCOUNTER — Encounter: Payer: Self-pay | Admitting: Vascular Surgery

## 2013-06-24 ENCOUNTER — Encounter (INDEPENDENT_AMBULATORY_CARE_PROVIDER_SITE_OTHER): Payer: BC Managed Care – PPO

## 2013-06-24 VITALS — BP 136/99 | HR 68 | Resp 18 | Ht 73.0 in | Wt 200.3 lb

## 2013-06-24 DIAGNOSIS — I83893 Varicose veins of bilateral lower extremities with other complications: Secondary | ICD-10-CM

## 2013-06-24 DIAGNOSIS — R230 Cyanosis: Secondary | ICD-10-CM

## 2013-06-24 NOTE — Progress Notes (Signed)
Subjective:     Patient ID: Tyrone Ortega, male   DOB: 04/08/1946, 67 y.o.   MRN: [id number]  HPI this 67 year old male is status post laser ablation right great saphenous vein performed last year. Since that time he has had decreased swelling in his right leg. He did notice some bluish discoloration of the right ankle area and wanted that evaluated. He has no history of DVT. His leg is less symptomatic than it was prior to his procedure. He has no history of stasis ulcers or bleeding.  Past Medical History  Diagnosis Date  . Decreased hearing   . RP (retinitis pigmentosa)   . Hypertension     History  Substance Use Topics  . Smoking status: Never Smoker   . Smokeless tobacco: Former Neurosurgeon    Quit date: 07/16/2004  . Alcohol Use: No    Family History  Problem Relation Age of Onset  . Kidney disease Mother   . Heart attack Father   . Heart disease Father   . Cancer Sister     breast  . Other Brother     mental retardation    No Known Allergies  Current outpatient prescriptions:doxylamine, Sleep, (UNISOM) 25 MG tablet, Take 25 mg by mouth at bedtime as needed for sleep., Disp: , Rfl: ;  latanoprost (XALATAN) 0.005 % ophthalmic solution, Place 1 drop into both eyes at bedtime. , Disp: , Rfl: ;  LISINOPRIL PO, Take 40 mg by mouth daily. , Disp: , Rfl:   BP 136/99  Pulse 68  Resp 18  Ht 6\' 1"  (1.854 m)  Wt 200 lb 4.8 oz (90.855 kg)  BMI 26.43 kg/m2  Body mass index is 26.43 kg/(m^2).           Review of Systems denies chest pain, dyspnea on exertion, PND, orthopnea, claudication.    Objective:   Physical Exam blood pressure 136/99 heart rate 68 respirations 18 General well-developed well-nourished male in no apparent distress alert and oriented x3 Lungs no rhonchi or wheezing Right lower extremity with 3+ femoral dorsalis pedis pulse palpable. There is a cluster of reticular veins in the medial ankle area near the medial malleolus with no active ulceration. Minimal  edema is present distally in the right ankle. There are a few scattered varicosities in the medial calf and posterior calf.  I ordered a venous duplex exam which are reviewed and interpreted. There is no DVT. There is good closure of the right great saphenous vein with a laser ablation was performed. There continues to be reflux at the small saphenous vein to popliteal vein junction with a very large vein at that point.     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

## 2018-08-08 ENCOUNTER — Ambulatory Visit: Payer: Self-pay | Admitting: Surgery

## 2018-08-08 NOTE — H&P (Signed)
Subjective:   CC: Lipoma of anterior chest wall [D17.1]  HPI:  Tyrone Ortega is a 72 y.o. male who was referred by Lisa Roca* for evaluation of  Abdominal wall lipoma. First noted several years ago.  Symptoms include: More annoying than painful.  Exacerbated by nothing specific.  Alleviated by nothing specific.  Associated with nothing specific.     Past Medical History:  has a past medical history of HOH (hard of hearing), Hypertension (diagnosed 02/28/2008), and Retinitis pigmentosa.  Past Surgical History:  has a past surgical history that includes Tonsillectomy; Cataract extraction (Bilateral); Hemangioblastoma (2012); Rt leg varicose vins; and Colonoscopy (05/06/2008).  Family History: family history includes Coronary Artery Disease (Blocked arteries around heart) in his father; Diabetes type II in his brother; Kidney cancer in his mother.  Social History:  reports that he has never smoked. He has never used smokeless tobacco. He reports that he drinks alcohol. His drug history is not on file.  Current Medications: has a current medication list which includes the following prescription(s): aspirin, azelastine, bimatoprost, cyanocobalamin (vitamin b-12), ergocalciferol (vitamin d2), latanoprost, and lisinopril.  Allergies:      Allergies  Allergen Reactions  . Oxycodone Nausea And Vomiting and Rash    ROS:  A 15 point review of systems was performed and pertinent positives and negatives noted in HPI   Objective:   BP 131/88   Pulse 72   Temp 36.6 C (97.8 F) (Oral)   Ht 185.4 cm (6\' 1" )   Wt 89.2 kg (196 lb 9.4 oz)   BMI 25.94 kg/m   Constitutional :  alert, appears stated age, cooperative and no distress  Lymphatics/Throat:  no asymmetry, masses, or scars  Respiratory:  clear to auscultation bilaterally  Cardiovascular:  regular rate and rhythm, S1, S2 normal, no murmur, click, rub or gallop  Gastrointestinal: soft, non-tender; bowel sounds  normal; no masses,  no organomegaly.    Musculoskeletal: Steady gait and movement  Skin: Cool and moist, on right abdominal wall, near costal margin, approx 7cm x 5cm, soft, mobile, non-tender superficial lesion consistent with lipoma noted.  No overlying skin changes.  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  Alternatives include continued observation.  Benefits include possible symptom relief, pathologic evaluation. Discussed the risk of surgery including recurrence, chronic pain, post-op infxn, poor cosmesis, poor/delayed wound healing, and possible re-operation to address said risks. The risks of general anesthetic, if used, includes MI, CVA, sudden death or even reaction to anesthetic medications also discussed.  Typical post-op recovery time of 3-5 days with possible activity restrictions were also discussed.  The patient verbalized understanding and all questions were answered to the patient's satisfaction.  2. Patient has elected to proceed with surgical treatment. Procedure will be scheduled in OR duet to size and location.  Written consent was obtained.     Electronically signed by Benjamine Sprague, DO on 07/30/2018 9:50 AM

## 2018-10-23 ENCOUNTER — Encounter
Admission: RE | Admit: 2018-10-23 | Discharge: 2018-10-23 | Disposition: A | Discharge status: Home / Self Care | Payer: Medicare Other | Source: Ambulatory Visit | Attending: Surgery | Admitting: Surgery

## 2018-10-23 ENCOUNTER — Ambulatory Visit: Payer: Self-pay | Admitting: Surgery

## 2018-10-23 ENCOUNTER — Other Ambulatory Visit: Payer: Self-pay

## 2018-10-23 DIAGNOSIS — Z01810 Encounter for preprocedural cardiovascular examination: Secondary | ICD-10-CM

## 2018-10-23 DIAGNOSIS — I10 Essential (primary) hypertension: Secondary | ICD-10-CM | POA: Insufficient documentation

## 2018-10-23 NOTE — Patient Instructions (Signed)
Your procedure is scheduled on: Tuesday, October 30, 2018 Report to Day Surgery on the 2nd floor of the [facility]. To find out your arrival time, please call [phone] between 1PM - 3PM on: Monday, October 29, 2018  REMEMBER: Instructions that are not followed completely may result in serious medical risk, up to and including death; or upon the discretion of your surgeon and anesthesiologist your surgery may need to be rescheduled.  Do not eat food after midnight the night before surgery.  No gum chewing, lozengers or hard candies.  You may however, drink CLEAR liquids up to 2 hours before you are scheduled to arrive for your surgery. Do not drink anything within 2 hours of the start of your surgery.  Clear liquids include: - water  - apple juice without pulp - gatorade - black coffee or tea (Do NOT add milk or creamers to the coffee or tea) Do NOT drink anything that is not on this list.  No Alcohol for 24 hours before or after surgery.  No Smoking including e-cigarettes for 24 hours prior to surgery.  No chewable tobacco products for at least 6 hours prior to surgery.  No nicotine patches on the day of surgery.  On the morning of surgery brush your teeth with toothpaste and water, you may rinse your mouth with mouthwash if you wish. Do not swallow any toothpaste or mouthwash.  Notify your doctor if there is any change in your medical condition (cold, fever, infection).  Do not wear jewelry, make-up, hairpins, clips or nail polish.  Do not wear lotions, powders, or perfumes.   Do not shave 48 hours prior to surgery.   Contacts and dentures may not be worn into surgery.  Do not bring valuables to the hospital, including drivers license, insurance or credit cards.  [facility] is not responsible for any belongings or valuables.   TAKE THESE MEDICATIONS THE MORNING OF SURGERY:  none  Use CHG Soap as directed on instruction sheet.  NOW!  Stop Anti-inflammatories  (NSAIDS) such as Advil, Aleve, Ibuprofen, Motrin, Naproxen, Naprosyn and Aspirin based products such as Excedrin, Goodys Powder, BC Powder. (May take Tylenol or Acetaminophen if needed.)  NOW!  Stop ANY OVER THE COUNTER supplements until after surgery.  Wear comfortable clothing (specific to your surgery type) to the hospital.  If you are being discharged the day of surgery, you will not be allowed to drive home. You will need a responsible adult to drive you home and stay with you that night.   If you are taking public transportation, you will need to have a responsible adult with you. Please confirm with your physician that it is acceptable to use public transportation.   Please call [phone] if you have any questions about these instructions.

## 2018-10-29 MED ORDER — CEFAZOLIN SODIUM-DEXTROSE 2-4 GM/100ML-% IV SOLN
2.0000 g | INTRAVENOUS | Status: AC
  Administered 2018-10-30: 2 g via INTRAVENOUS

## 2018-10-30 ENCOUNTER — Ambulatory Visit: Payer: Medicare Other | Admitting: Anesthesiology

## 2018-10-30 ENCOUNTER — Encounter
Admission: RE | Disposition: A | Discharge status: Home / Self Care | Payer: Self-pay | Source: Ambulatory Visit | Attending: Surgery

## 2018-10-30 ENCOUNTER — Encounter: Payer: Self-pay | Admitting: *Deleted

## 2018-10-30 ENCOUNTER — Other Ambulatory Visit: Payer: Self-pay

## 2018-10-30 ENCOUNTER — Ambulatory Visit
Admission: RE | Admit: 2018-10-30 | Discharge: 2018-10-30 | Disposition: A | Discharge status: Home / Self Care | Payer: Medicare Other | Source: Ambulatory Visit | Attending: Surgery | Admitting: Surgery

## 2018-10-30 DIAGNOSIS — Z79899 Other long term (current) drug therapy: Secondary | ICD-10-CM | POA: Insufficient documentation

## 2018-10-30 DIAGNOSIS — I10 Essential (primary) hypertension: Secondary | ICD-10-CM | POA: Diagnosis not present

## 2018-10-30 DIAGNOSIS — D171 Benign lipomatous neoplasm of skin and subcutaneous tissue of trunk: Secondary | ICD-10-CM | POA: Diagnosis not present

## 2018-10-30 DIAGNOSIS — Z7982 Long term (current) use of aspirin: Secondary | ICD-10-CM | POA: Diagnosis not present

## 2018-10-30 DIAGNOSIS — R222 Localized swelling, mass and lump, trunk: Secondary | ICD-10-CM | POA: Diagnosis present

## 2018-10-30 DIAGNOSIS — Z885 Allergy status to narcotic agent status: Secondary | ICD-10-CM | POA: Diagnosis not present

## 2018-10-30 HISTORY — PX: LIPOMA EXCISION: SHX5283

## 2018-10-30 SURGERY — EXCISION LIPOMA
Anesthesia: General

## 2018-10-30 MED ORDER — LIDOCAINE HCL (CARDIAC) PF 100 MG/5ML IV SOSY
PREFILLED_SYRINGE | INTRAVENOUS | Status: DC | PRN
  Administered 2018-10-30: 100 mg via INTRAVENOUS

## 2018-10-30 MED ORDER — LACTATED RINGERS IV SOLN
INTRAVENOUS | Status: DC
  Administered 2018-10-30: 07:00:00 via INTRAVENOUS

## 2018-10-30 MED ORDER — FENTANYL CITRATE (PF) 100 MCG/2ML IJ SOLN: 25.0000 ug | INTRAMUSCULAR | Status: DC | PRN

## 2018-10-30 MED ORDER — ACETAMINOPHEN 325 MG PO TABS: 650.0000 mg | ORAL_TABLET | Freq: Three times a day (TID) | ORAL | 0 refills | Status: AC | PRN

## 2018-10-30 MED ORDER — ONDANSETRON HCL 4 MG/2ML IJ SOLN: 4.0000 mg | Freq: Once | INTRAMUSCULAR | Status: DC | PRN

## 2018-10-30 MED ORDER — LIDOCAINE-EPINEPHRINE (PF) 1 %-1:200000 IJ SOLN
INTRAMUSCULAR | Status: AC
  Filled 2018-10-30: qty 30

## 2018-10-30 MED ORDER — PROPOFOL 10 MG/ML IV BOLUS
INTRAVENOUS | Status: AC
  Filled 2018-10-30: qty 20

## 2018-10-30 MED ORDER — IBUPROFEN 800 MG PO TABS: 800.0000 mg | ORAL_TABLET | Freq: Three times a day (TID) | ORAL | 0 refills | Status: DC | PRN

## 2018-10-30 MED ORDER — PROPOFOL 10 MG/ML IV BOLUS
INTRAVENOUS | Status: DC | PRN
  Administered 2018-10-30: 150 mg via INTRAVENOUS

## 2018-10-30 MED ORDER — CEFAZOLIN SODIUM-DEXTROSE 2-4 GM/100ML-% IV SOLN
INTRAVENOUS | Status: AC
  Filled 2018-10-30: qty 100

## 2018-10-30 MED ORDER — DOCUSATE SODIUM 100 MG PO CAPS: 100.0000 mg | ORAL_CAPSULE | Freq: Two times a day (BID) | ORAL | 0 refills | Status: AC | PRN

## 2018-10-30 MED ORDER — BUPIVACAINE HCL (PF) 0.5 % IJ SOLN
INTRAMUSCULAR | Status: AC
  Filled 2018-10-30: qty 30

## 2018-10-30 MED ORDER — FAMOTIDINE 20 MG PO TABS
ORAL_TABLET | ORAL | Status: AC
  Administered 2018-10-30: 20 mg via ORAL
  Filled 2018-10-30: qty 1

## 2018-10-30 MED ORDER — FENTANYL CITRATE (PF) 100 MCG/2ML IJ SOLN
INTRAMUSCULAR | Status: AC
  Filled 2018-10-30: qty 2

## 2018-10-30 MED ORDER — BUPIVACAINE HCL (PF) 0.5 % IJ SOLN
INTRAMUSCULAR | Status: DC | PRN
  Administered 2018-10-30: 5 mL

## 2018-10-30 MED ORDER — FAMOTIDINE 20 MG PO TABS
20.0000 mg | ORAL_TABLET | Freq: Once | ORAL | Status: AC
  Administered 2018-10-30: 20 mg via ORAL

## 2018-10-30 MED ORDER — ONDANSETRON HCL 4 MG/2ML IJ SOLN
INTRAMUSCULAR | Status: DC | PRN
  Administered 2018-10-30: 4 mg via INTRAVENOUS

## 2018-10-30 MED ORDER — CHLORHEXIDINE GLUCONATE CLOTH 2 % EX PADS: 6.0000 | MEDICATED_PAD | Freq: Once | CUTANEOUS | Status: DC

## 2018-10-30 MED ORDER — MIDAZOLAM HCL 2 MG/2ML IJ SOLN
INTRAMUSCULAR | Status: AC
  Filled 2018-10-30: qty 2

## 2018-10-30 MED ORDER — LIDOCAINE-EPINEPHRINE (PF) 1 %-1:200000 IJ SOLN
INTRAMUSCULAR | Status: DC | PRN
  Administered 2018-10-30: 5 mL

## 2018-10-30 MED ORDER — FENTANYL CITRATE (PF) 100 MCG/2ML IJ SOLN
INTRAMUSCULAR | Status: DC | PRN
  Administered 2018-10-30 (×2): 50 ug via INTRAVENOUS

## 2018-10-30 MED ORDER — DEXAMETHASONE SODIUM PHOSPHATE 10 MG/ML IJ SOLN
INTRAMUSCULAR | Status: DC | PRN
  Administered 2018-10-30: 10 mg via INTRAVENOUS

## 2018-10-30 SURGICAL SUPPLY — 29 items
CHLORAPREP W/TINT 26ML (MISCELLANEOUS) ×3 IMPLANT
COVER WAND RF STERILE (DRAPES) IMPLANT
DERMABOND ADVANCED (GAUZE/BANDAGES/DRESSINGS) ×2
DERMABOND ADVANCED .7 DNX12 (GAUZE/BANDAGES/DRESSINGS) ×1 IMPLANT
DRAPE LAP WINGED 104X35X124 (DRAPES) ×3 IMPLANT
DRAPE LAPAROTOMY 100X77 ABD (DRAPES) ×3 IMPLANT
DRAPE SHEET LG 3/4 BI-LAMINATE (DRAPES) ×6 IMPLANT
ELECT CAUTERY BLADE 6.4 (BLADE) ×3 IMPLANT
ELECT REM PT RETURN 9FT ADLT (ELECTROSURGICAL) ×3
ELECTRODE REM PT RTRN 9FT ADLT (ELECTROSURGICAL) ×1 IMPLANT
GLOVE BIO SURGEON STRL SZ 6.5 (GLOVE) ×2 IMPLANT
GLOVE BIO SURGEONS STRL SZ 6.5 (GLOVE) ×1
GLOVE BIOGEL PI IND STRL 7.0 (GLOVE) ×1 IMPLANT
GLOVE BIOGEL PI INDICATOR 7.0 (GLOVE) ×2
GOWN STRL REUS W/ TWL LRG LVL3 (GOWN DISPOSABLE) ×2 IMPLANT
GOWN STRL REUS W/TWL LRG LVL3 (GOWN DISPOSABLE) ×4
KIT TURNOVER KIT A (KITS) ×3 IMPLANT
LABEL OR SOLS (LABEL) ×3 IMPLANT
NEEDLE HYPO 22GX1.5 SAFETY (NEEDLE) ×3 IMPLANT
NS IRRIG 1000ML POUR BTL (IV SOLUTION) ×3 IMPLANT
PACK BASIN MINOR ARMC (MISCELLANEOUS) ×3 IMPLANT
SUT MNCRL 4-0 (SUTURE) ×2
SUT MNCRL 4-0 27XMFL (SUTURE) ×1
SUT VIC AB 3-0 SH 27 (SUTURE) ×2
SUT VIC AB 3-0 SH 27X BRD (SUTURE) ×1 IMPLANT
SUTURE MNCRL 4-0 27XMF (SUTURE) ×1 IMPLANT
SYR 30ML LL (SYRINGE) ×3 IMPLANT
SYR BULB IRRIG 60ML STRL (SYRINGE) ×3 IMPLANT
TOWEL OR 17X26 4PK STRL BLUE (TOWEL DISPOSABLE) ×3 IMPLANT

## 2018-10-30 NOTE — Anesthesia Preprocedure Evaluation (Signed)
Anesthesia Evaluation  Patient identified by MRN, date of birth, ID band Patient awake    Reviewed: Allergy & Precautions, NPO status , Patient's Chart, lab work & pertinent test results  History of Anesthesia Complications Negative for: history of anesthetic complications  Airway Mallampati: III       Dental   Pulmonary neg sleep apnea, neg COPD,           Cardiovascular hypertension, Pt. on medications (-) Past MI and (-) CHF (-) dysrhythmias (-) Valvular Problems/Murmurs     Neuro/Psych neg Seizures    GI/Hepatic Neg liver ROS, neg GERD  ,  Endo/Other  neg diabetes  Renal/GU negative Renal ROS     Musculoskeletal   Abdominal   Peds  Hematology   Anesthesia Other Findings   Reproductive/Obstetrics                             Anesthesia Physical Anesthesia Plan  ASA: II  Anesthesia Plan: General   Post-op Pain Management:    Induction: Intravenous  PONV Risk Score and Plan: 2  Airway Management Planned: LMA  Additional Equipment:   Intra-op Plan:   Post-operative Plan:   Informed Consent: I have reviewed the patients History and Physical, chart, labs and discussed the procedure including the risks, benefits and alternatives for the proposed anesthesia with the patient or authorized representative who has indicated his/her understanding and acceptance.     Plan Discussed with:   Anesthesia Plan Comments:         Anesthesia Quick Evaluation

## 2018-10-30 NOTE — Op Note (Signed)
Pre-Op Dx: left chest wall mass Post-Op Dx: same Anesthesia: local EBL: minimal Complications:  none apparent Specimen: Procedure: excisional biopsy of left chest wall mass   Description of Procedure:  Consent obtained, time out performed.  Patient placed in supine position.  Area sterilized and draped in usual position.  Local infused to area previously marked.  5cm incision made through dermis with 15blade and lipoma like mass noted in subcutaneous layer down to the fascia.  The  4cm x 3cm x 2cm mass then removed from surrounding tissue completely using electrocautery down to fascia, passed off field pending pathology.  Wound hemostasis noted, then closed in two layer fashion with 3-0 vicryl in interrupted fashion for deep dermal layer, then running 4-0 monocryl in subcuticular fashion for epidermal layer.  Wound then dressed with dermabond.  Pt tolerated procedure well, and transferred to PACU in stable condition. Sponge and instrument count correct at end of procedure.

## 2018-10-30 NOTE — Anesthesia Post-op Follow-up Note (Signed)
Anesthesia QCDR form completed.        

## 2018-10-30 NOTE — Anesthesia Procedure Notes (Signed)
Procedure Name: LMA Insertion Date/Time: 10/30/2018 7:32 AM Performed by: Philbert Riser, CRNA Pre-anesthesia Checklist: Patient identified, Emergency Drugs available, Suction available, Patient being monitored and Timeout performed Patient Re-evaluated:Patient Re-evaluated prior to induction Oxygen Delivery Method: Circle system utilized and Simple face mask Preoxygenation: Pre-oxygenation with 100% oxygen Induction Type: IV induction Ventilation: Mask ventilation without difficulty LMA Size: 4.0 Dental Injury: Teeth and Oropharynx as per pre-operative assessment

## 2018-10-30 NOTE — Interval H&P Note (Signed)
History and Physical Interval Note:  10/30/2018 7:21 AM  Tyrone Ortega  has presented today for surgery, with the diagnosis of LIPOMA OF ABDOMINAL WALL  The various methods of treatment have been discussed with the patient and family. After consideration of risks, benefits and other options for treatment, the patient has consented to  Procedure(s): EXCISION LIPOMA ABDOMINAL WALL (N/A) as a surgical intervention .  The patient's history has been reviewed, patient examined, no change in status, stable for surgery.  I have reviewed the patient's chart and labs.  Questions were answered to the patient's satisfaction.     Alejandro Gamel Lysle Pearl

## 2018-10-30 NOTE — Anesthesia Postprocedure Evaluation (Signed)
Anesthesia Post Note  Patient: Tyrone Ortega  Procedure(s) Performed: EXCISION LIPOMA  ABDOMINAL WALL (N/A )  Patient location during evaluation: PACU Anesthesia Type: General Level of consciousness: sedated Pain management: pain level controlled Vital Signs Assessment: post-procedure vital signs reviewed and stable Respiratory status: spontaneous breathing and respiratory function stable Cardiovascular status: stable Anesthetic complications: no     Last Vitals:  Vitals:   10/30/18 0608 10/30/18 0810  BP: (!) 147/92 124/81  Pulse: 66 (!) 59  Resp: 18 14  Temp: (!) 36 C (!) 35.8 C  SpO2: 99% 100%    Last Pain:  Vitals:   10/30/18 0810  TempSrc:   PainSc: Asleep                 KEPHART,WILLIAM K

## 2018-10-30 NOTE — Transfer of Care (Signed)
Immediate Anesthesia Transfer of Care Note  Patient: Tyrone Ortega  Procedure(s) Performed: EXCISION LIPOMA  ABDOMINAL WALL (N/A )  Patient Location: PACU  Anesthesia Type:General  Level of Consciousness: sedated  Airway & Oxygen Therapy: Patient Spontanous Breathing and Patient connected to face mask oxygen  Post-op Assessment: Report given to RN and Post -op Vital signs reviewed and stable  Post vital signs: Reviewed and stable  Last Vitals:  Vitals Value Taken Time  BP    Temp    Pulse 59 10/30/2018  8:10 AM  Resp 14 10/30/2018  8:10 AM  SpO2 100 % 10/30/2018  8:10 AM  Vitals shown include unvalidated device data.  Last Pain:  Vitals:   10/30/18 0608  TempSrc: Temporal  PainSc: 0-No pain         Complications: No apparent anesthesia complications

## 2018-10-30 NOTE — H&P (Signed)
Subjective:   CC: Lipoma of anterior chest wall [D17.1]  HPI: Tyrone Ortega is a 72 y.o. male who was referred by Lisa Roca* for evaluation of Abdominal wall lipoma. First noted several years ago. Symptoms include: More annoying than painful. Exacerbated by nothing specific. Alleviated by nothing specific. Associated with nothing specific.  No change since last visit  Past Medical History: has a past medical history of HOH (hard of hearing), Hypertension (diagnosed 02/28/2008), and Retinitis pigmentosa.  Past Surgical History: has a past surgical history that includes Tonsillectomy; Cataract extraction (Bilateral); Hemangioblastoma (2012); Rt leg varicose vins; and Colonoscopy (05/06/2008).  Family History: family history includes Coronary Artery Disease (Blocked arteries around heart) in his father; Diabetes type II in his brother; Kidney cancer in his mother.  Social History: reports that he has never smoked. He has never used smokeless tobacco. He reports that he drinks alcohol. His drug history is not on file.  Current Medications: has a current medication list which includes the following prescription(s): aspirin, azelastine, bimatoprost, cyanocobalamin (vitamin b-12), ergocalciferol (vitamin d2), latanoprost, and lisinopril.  Allergies:  Allergies  Allergen Reactions  . Oxycodone Nausea And Vomiting and Rash   ROS:  A 15 point review of systems was performed and pertinent positives and negatives noted in HPI  Objective:    BP 131/88  Pulse 72  Temp 36.6 C (97.8 F) (Oral)  Ht 185.4 cm (6\' 1" )  Wt 89.2 kg (196 lb 9.4 oz)  BMI 25.94 kg/m   Constitutional : alert, appears stated age, cooperative and no distress  Lymphatics/Throat: no asymmetry, masses, or scars  Respiratory: clear to auscultation bilaterally  Cardiovascular: regular rate and rhythm, S1, S2 normal, no murmur, click, rub or gallop  Gastrointestinal: soft, non-tender; bowel sounds normal; no  masses, no organomegaly.  Musculoskeletal: Steady gait and movement  Skin: Cool and moist, on right abdominal wall, near costal margin, approx 7cm x 5cm, soft, mobile, non-tender superficial lesion consistent with lipoma noted. No overlying skin changes.  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 Alternatives include continued observation. Benefits include possible symptom relief, pathologic evaluation. Discussed the risk of surgery including recurrence, chronic pain, post-op infxn, poor cosmesis, poor/delayed wound healing, and possible re-operation to address said risks. The risks of general anesthetic, if used, includes MI, CVA, sudden death or even reaction to anesthetic medications also discussed.  Typical post-op recovery time of 3-5 days with possible activity restrictions were also discussed.  The patient verbalized understanding and all questions were answered to the patient's satisfaction.  2. Patient has elected to proceed with surgical treatment. Procedure will be scheduled in OR duet to size and location. Written consent was obtained.

## 2018-10-30 NOTE — Discharge Instructions (Signed)

## 2018-10-30 NOTE — OR Nursing (Signed)
Discussed discharge instructions with pt and family. Both voice understanding.

## 2018-10-31 LAB — SURGICAL PATHOLOGY

## 2021-09-22 ENCOUNTER — Other Ambulatory Visit: Payer: Self-pay

## 2022-09-23 ENCOUNTER — Other Ambulatory Visit: Payer: Self-pay | Admitting: Orthopedic Surgery

## 2022-09-23 NOTE — Anesthesia Preprocedure Evaluation (Signed)
Anesthesia Evaluation  Patient identified by MRN, date of birth, ID band Patient awake    Reviewed: Allergy & Precautions, NPO status , Patient's Chart, lab work & pertinent test results  Airway Mallampati: III  TM Distance: >3 FB Neck ROM: full    Dental no notable dental hx.    Pulmonary neg pulmonary ROS,    Pulmonary exam normal        Cardiovascular Exercise Tolerance: Good hypertension, Normal cardiovascular exam     Neuro/Psych Cerebellar hemangioblastomatosis negative psych ROS   GI/Hepatic negative GI ROS, Neg liver ROS,   Endo/Other  negative endocrine ROS  Renal/GU      Musculoskeletal   Abdominal Normal abdominal exam  (+)   Peds  Hematology negative hematology ROS (+)   Anesthesia Other Findings  Displaced fracture of left femoral neck   Past Medical History: No date: Decreased hearing No date: Hypertension No date: RP (retinitis pigmentosa)  Past Surgical History: No date: BRAIN SURGERY     Comment:  12/26/2011; benign tumor removed No date: COLONOSCOPY bilateral cataract removal: EYE SURGERY 10/30/2018: LIPOMA EXCISION; N/A     Comment:  Procedure: EXCISION LIPOMA  ABDOMINAL WALL;  Surgeon:               Benjamine Sprague, DO;  Location: [facility] ORS;  Service: General;              Laterality: N/A; No date: TONSILLECTOMY     Reproductive/Obstetrics negative OB ROS                            Anesthesia Physical Anesthesia Plan  ASA: 2  Anesthesia Plan: General and Spinal   Post-op Pain Management: Regional block*, Toradol IV (intra-op)* and Ofirmev IV (intra-op)*   Induction: Intravenous  PONV Risk Score and Plan: 2 and Ondansetron and Dexamethasone  Airway Management Planned: Natural Airway  Additional Equipment:   Intra-op Plan:   Post-operative Plan:   Informed Consent: I have reviewed the patients History and Physical, chart, labs and discussed the  procedure including the risks, benefits and alternatives for the proposed anesthesia with the patient or authorized representative who has indicated his/her understanding and acceptance.     Dental advisory given  Plan Discussed with: Anesthesiologist, CRNA and Surgeon  Anesthesia Plan Comments:       Anesthesia Quick Evaluation

## 2022-09-25 MED ORDER — LACTATED RINGERS IV SOLN: INTRAVENOUS | Status: DC

## 2022-09-25 MED ORDER — ORAL CARE MOUTH RINSE: 15.0000 mL | Freq: Once | OROMUCOSAL | Status: AC

## 2022-09-25 MED ORDER — CHLORHEXIDINE GLUCONATE 0.12 % MT SOLN: 15.0000 mL | Freq: Once | OROMUCOSAL | Status: AC

## 2022-09-25 MED ORDER — CEFAZOLIN SODIUM-DEXTROSE 2-4 GM/100ML-% IV SOLN
2.0000 g | INTRAVENOUS | Status: AC
  Administered 2022-09-26: 2 g via INTRAVENOUS

## 2022-09-26 ENCOUNTER — Other Ambulatory Visit: Payer: Self-pay

## 2022-09-26 ENCOUNTER — Inpatient Hospital Stay: Payer: Medicare Other | Admitting: Anesthesiology

## 2022-09-26 ENCOUNTER — Encounter
Admission: RE | Disposition: A | Discharge status: Home Health | Payer: Self-pay | Source: Home / Self Care | Attending: Orthopedic Surgery

## 2022-09-26 ENCOUNTER — Inpatient Hospital Stay
Admission: RE | Admit: 2022-09-26 | Discharge: 2022-09-27 | DRG: 482 | Disposition: A | Discharge status: Home Health | Payer: Medicare Other | Attending: Orthopedic Surgery | Admitting: Orthopedic Surgery

## 2022-09-26 ENCOUNTER — Encounter: Payer: Self-pay | Admitting: *Deleted

## 2022-09-26 ENCOUNTER — Inpatient Hospital Stay: Payer: Medicare Other

## 2022-09-26 DIAGNOSIS — E039 Hypothyroidism, unspecified: Secondary | ICD-10-CM | POA: Diagnosis present

## 2022-09-26 DIAGNOSIS — Z8249 Family history of ischemic heart disease and other diseases of the circulatory system: Secondary | ICD-10-CM

## 2022-09-26 DIAGNOSIS — Z9841 Cataract extraction status, right eye: Secondary | ICD-10-CM

## 2022-09-26 DIAGNOSIS — Z85841 Personal history of malignant neoplasm of brain: Secondary | ICD-10-CM

## 2022-09-26 DIAGNOSIS — W109XXA Fall (on) (from) unspecified stairs and steps, initial encounter: Secondary | ICD-10-CM | POA: Diagnosis present

## 2022-09-26 DIAGNOSIS — I10 Essential (primary) hypertension: Secondary | ICD-10-CM | POA: Diagnosis present

## 2022-09-26 DIAGNOSIS — Z9842 Cataract extraction status, left eye: Secondary | ICD-10-CM | POA: Diagnosis not present

## 2022-09-26 DIAGNOSIS — Y92832 Beach as the place of occurrence of the external cause: Secondary | ICD-10-CM | POA: Diagnosis not present

## 2022-09-26 DIAGNOSIS — H9190 Unspecified hearing loss, unspecified ear: Secondary | ICD-10-CM | POA: Diagnosis present

## 2022-09-26 DIAGNOSIS — Z83511 Family history of glaucoma: Secondary | ICD-10-CM | POA: Diagnosis not present

## 2022-09-26 DIAGNOSIS — Z79899 Other long term (current) drug therapy: Secondary | ICD-10-CM

## 2022-09-26 DIAGNOSIS — Z8051 Family history of malignant neoplasm of kidney: Secondary | ICD-10-CM | POA: Diagnosis not present

## 2022-09-26 DIAGNOSIS — H3552 Pigmentary retinal dystrophy: Secondary | ICD-10-CM | POA: Diagnosis present

## 2022-09-26 DIAGNOSIS — Z803 Family history of malignant neoplasm of breast: Secondary | ICD-10-CM

## 2022-09-26 DIAGNOSIS — S72002A Fracture of unspecified part of neck of left femur, initial encounter for closed fracture: Principal | ICD-10-CM | POA: Diagnosis present

## 2022-09-26 DIAGNOSIS — Z833 Family history of diabetes mellitus: Secondary | ICD-10-CM | POA: Diagnosis not present

## 2022-09-26 DIAGNOSIS — Z885 Allergy status to narcotic agent status: Secondary | ICD-10-CM | POA: Diagnosis not present

## 2022-09-26 DIAGNOSIS — H409 Unspecified glaucoma: Secondary | ICD-10-CM | POA: Diagnosis present

## 2022-09-26 DIAGNOSIS — R230 Cyanosis: Principal | ICD-10-CM

## 2022-09-26 HISTORY — PX: TOTAL HIP ARTHROPLASTY: SHX124

## 2022-09-26 LAB — TYPE AND SCREEN
ABO/RH(D): A POS
Antibody Screen: NEGATIVE

## 2022-09-26 LAB — URINALYSIS, ROUTINE W REFLEX MICROSCOPIC
Bilirubin Urine: NEGATIVE
Glucose, UA: NEGATIVE mg/dL
Hgb urine dipstick: NEGATIVE
Ketones, ur: NEGATIVE mg/dL
Nitrite: NEGATIVE
Protein, ur: NEGATIVE mg/dL
Specific Gravity, Urine: 1.012 (ref 1.005–1.030)
Squamous Epithelial / HPF: NONE SEEN (ref 0–5)
pH: 6 (ref 5.0–8.0)

## 2022-09-26 LAB — CBC WITH DIFFERENTIAL/PLATELET
Abs Immature Granulocytes: 0.02 10*3/uL (ref 0.00–0.07)
Basophils Absolute: 0 10*3/uL (ref 0.0–0.1)
Basophils Relative: 0 %
Eosinophils Absolute: 0.2 10*3/uL (ref 0.0–0.5)
Eosinophils Relative: 2 %
HCT: 45.2 % (ref 39.0–52.0)
Hemoglobin: 15.7 g/dL (ref 13.0–17.0)
Immature Granulocytes: 0 %
Lymphocytes Relative: 17 %
Lymphs Abs: 1.7 10*3/uL (ref 0.7–4.0)
MCH: 30.2 pg (ref 26.0–34.0)
MCHC: 34.7 g/dL (ref 30.0–36.0)
MCV: 86.9 fL (ref 80.0–100.0)
Monocytes Absolute: 0.7 10*3/uL (ref 0.1–1.0)
Monocytes Relative: 7 %
Neutro Abs: 7.3 10*3/uL (ref 1.7–7.7)
Neutrophils Relative %: 74 %
Platelets: 267 10*3/uL (ref 150–400)
RBC: 5.2 MIL/uL (ref 4.22–5.81)
RDW: 12.3 % (ref 11.5–15.5)
WBC: 10 10*3/uL (ref 4.0–10.5)
nRBC: 0 % (ref 0.0–0.2)

## 2022-09-26 LAB — COMPREHENSIVE METABOLIC PANEL
ALT: 14 U/L (ref 0–44)
AST: 19 U/L (ref 15–41)
Albumin: 4 g/dL (ref 3.5–5.0)
Alkaline Phosphatase: 73 U/L (ref 38–126)
Anion gap: 5 (ref 5–15)
BUN: 19 mg/dL (ref 8–23)
CO2: 26 mmol/L (ref 22–32)
Calcium: 8.9 mg/dL (ref 8.9–10.3)
Chloride: 101 mmol/L (ref 98–111)
Creatinine, Ser: 1.12 mg/dL (ref 0.61–1.24)
GFR, Estimated: >60 mL/min (ref >60)
Glucose, Bld: 109 mg/dL — ABNORMAL HIGH (ref 70–99)
Potassium: 3.8 mmol/L (ref 3.5–5.1)
Sodium: 132 mmol/L — ABNORMAL LOW (ref 135–145)
Total Bilirubin: 1.1 mg/dL (ref 0.3–1.2)
Total Protein: 7.1 g/dL (ref 6.5–8.1)

## 2022-09-26 SURGERY — ARTHROPLASTY, HIP, TOTAL, ANTERIOR APPROACH
Anesthesia: General | Site: Hip | Laterality: Left

## 2022-09-26 MED ORDER — TRAMADOL HCL 50 MG PO TABS
ORAL_TABLET | ORAL | Status: AC
  Administered 2022-09-26: 50 mg via ORAL
  Filled 2022-09-26: qty 1

## 2022-09-26 MED ORDER — TRAMADOL HCL 50 MG PO TABS: 50.0000 mg | ORAL_TABLET | Freq: Once | ORAL | Status: AC

## 2022-09-26 MED ORDER — ACETAMINOPHEN 10 MG/ML IV SOLN: 1000.0000 mg | Freq: Once | INTRAVENOUS | Status: DC | PRN

## 2022-09-26 MED ORDER — LISINOPRIL 20 MG PO TABS: 40.0000 mg | ORAL_TABLET | Freq: Every day | ORAL | Status: DC

## 2022-09-26 MED ORDER — PHENYLEPHRINE HCL (PRESSORS) 10 MG/ML IV SOLN
INTRAVENOUS | Status: DC | PRN
  Administered 2022-09-26: 40 ug via INTRAVENOUS
  Administered 2022-09-26 (×3): 80 ug via INTRAVENOUS
  Administered 2022-09-26: 40 ug via INTRAVENOUS
  Administered 2022-09-26: 80 ug via INTRAVENOUS

## 2022-09-26 MED ORDER — KETOROLAC TROMETHAMINE 15 MG/ML IJ SOLN
7.5000 mg | Freq: Four times a day (QID) | INTRAMUSCULAR | Status: DC
  Administered 2022-09-26 – 2022-09-27 (×3): 7.5 mg via INTRAVENOUS
  Filled 2022-09-26 (×3): qty 1

## 2022-09-26 MED ORDER — EPHEDRINE SULFATE (PRESSORS) 50 MG/ML IJ SOLN
INTRAMUSCULAR | Status: DC | PRN
  Administered 2022-09-26: 10 mg via INTRAVENOUS
  Administered 2022-09-26: 5 mg via INTRAVENOUS

## 2022-09-26 MED ORDER — MORPHINE SULFATE (PF) 2 MG/ML IV SOLN: 0.5000 mg | INTRAVENOUS | Status: DC | PRN

## 2022-09-26 MED ORDER — OXYCODONE HCL 5 MG PO TABS: 5.0000 mg | ORAL_TABLET | Freq: Once | ORAL | Status: DC | PRN

## 2022-09-26 MED ORDER — DOCUSATE SODIUM 100 MG PO CAPS
100.0000 mg | ORAL_CAPSULE | Freq: Two times a day (BID) | ORAL | Status: DC
  Administered 2022-09-26 – 2022-09-27 (×2): 100 mg via ORAL
  Filled 2022-09-26 (×2): qty 1

## 2022-09-26 MED ORDER — ONDANSETRON HCL 4 MG PO TABS: 4.0000 mg | ORAL_TABLET | Freq: Four times a day (QID) | ORAL | Status: DC | PRN

## 2022-09-26 MED ORDER — KETOROLAC TROMETHAMINE 30 MG/ML IJ SOLN
INTRAMUSCULAR | Status: AC
  Filled 2022-09-26: qty 1

## 2022-09-26 MED ORDER — KETOROLAC TROMETHAMINE 30 MG/ML IJ SOLN
INTRAMUSCULAR | Status: DC | PRN
  Administered 2022-09-26: 30 mg via INTRAVENOUS

## 2022-09-26 MED ORDER — MENTHOL 3 MG MT LOZG: 1.0000 | LOZENGE | OROMUCOSAL | Status: DC | PRN

## 2022-09-26 MED ORDER — 0.9 % SODIUM CHLORIDE (POUR BTL) OPTIME
TOPICAL | Status: DC | PRN
  Administered 2022-09-26: 500 mL

## 2022-09-26 MED ORDER — TRANEXAMIC ACID-NACL 1000-0.7 MG/100ML-% IV SOLN
INTRAVENOUS | Status: DC | PRN
  Administered 2022-09-26: 1000 mg via INTRAVENOUS

## 2022-09-26 MED ORDER — METOCLOPRAMIDE HCL 5 MG/ML IJ SOLN: 5.0000 mg | Freq: Three times a day (TID) | INTRAMUSCULAR | Status: DC | PRN

## 2022-09-26 MED ORDER — ONDANSETRON HCL 4 MG/2ML IJ SOLN: 4.0000 mg | Freq: Four times a day (QID) | INTRAMUSCULAR | Status: DC | PRN

## 2022-09-26 MED ORDER — DEXMEDETOMIDINE HCL IN NACL 200 MCG/50ML IV SOLN
INTRAVENOUS | Status: DC | PRN
  Administered 2022-09-26: 8 ug via INTRAVENOUS

## 2022-09-26 MED ORDER — CEFAZOLIN SODIUM-DEXTROSE 2-4 GM/100ML-% IV SOLN
2.0000 g | Freq: Four times a day (QID) | INTRAVENOUS | Status: AC
  Administered 2022-09-26 (×2): 2 g via INTRAVENOUS
  Filled 2022-09-26 (×2): qty 100

## 2022-09-26 MED ORDER — ONDANSETRON HCL 4 MG/2ML IJ SOLN
INTRAMUSCULAR | Status: AC
  Administered 2022-09-26: 4 mg via INTRAVENOUS
  Filled 2022-09-26: qty 2

## 2022-09-26 MED ORDER — FENTANYL CITRATE (PF) 100 MCG/2ML IJ SOLN
25.0000 ug | INTRAMUSCULAR | Status: DC | PRN
  Administered 2022-09-26 (×3): 25 ug via INTRAVENOUS

## 2022-09-26 MED ORDER — BUPIVACAINE LIPOSOME 1.3 % IJ SUSP
INTRAMUSCULAR | Status: AC
  Filled 2022-09-26: qty 20

## 2022-09-26 MED ORDER — METOCLOPRAMIDE HCL 5 MG PO TABS: 5.0000 mg | ORAL_TABLET | Freq: Three times a day (TID) | ORAL | Status: DC | PRN

## 2022-09-26 MED ORDER — ONDANSETRON HCL 4 MG/2ML IJ SOLN
INTRAMUSCULAR | Status: AC
  Filled 2022-09-26: qty 2

## 2022-09-26 MED ORDER — TRANEXAMIC ACID-NACL 1000-0.7 MG/100ML-% IV SOLN
INTRAVENOUS | Status: AC
  Filled 2022-09-26: qty 200

## 2022-09-26 MED ORDER — ACETAMINOPHEN 500 MG PO TABS: 1000.0000 mg | ORAL_TABLET | Freq: Once | ORAL | Status: AC

## 2022-09-26 MED ORDER — MIDAZOLAM HCL 2 MG/2ML IJ SOLN
INTRAMUSCULAR | Status: DC | PRN
  Administered 2022-09-26: 2 mg via INTRAVENOUS

## 2022-09-26 MED ORDER — SENNOSIDES-DOCUSATE SODIUM 8.6-50 MG PO TABS
1.0000 | ORAL_TABLET | Freq: Two times a day (BID) | ORAL | Status: DC
  Administered 2022-09-26 – 2022-09-27 (×2): 1 via ORAL
  Filled 2022-09-26 (×2): qty 1

## 2022-09-26 MED ORDER — ROCURONIUM BROMIDE 100 MG/10ML IV SOLN
INTRAVENOUS | Status: DC | PRN
  Administered 2022-09-26: 10 mg via INTRAVENOUS
  Administered 2022-09-26: 50 mg via INTRAVENOUS

## 2022-09-26 MED ORDER — TRAMADOL HCL 50 MG PO TABS: 50.0000 mg | ORAL_TABLET | Freq: Once | ORAL | Status: DC | PRN

## 2022-09-26 MED ORDER — ONDANSETRON HCL 4 MG/2ML IJ SOLN
INTRAMUSCULAR | Status: DC | PRN
  Administered 2022-09-26: 4 mg via INTRAVENOUS

## 2022-09-26 MED ORDER — FENTANYL CITRATE (PF) 100 MCG/2ML IJ SOLN
INTRAMUSCULAR | Status: DC | PRN
  Administered 2022-09-26 (×2): 50 ug via INTRAVENOUS

## 2022-09-26 MED ORDER — DEXAMETHASONE SODIUM PHOSPHATE 10 MG/ML IJ SOLN
INTRAMUSCULAR | Status: AC
  Filled 2022-09-26: qty 1

## 2022-09-26 MED ORDER — OXYCODONE HCL 5 MG/5ML PO SOLN: 5.0000 mg | Freq: Once | ORAL | Status: DC | PRN

## 2022-09-26 MED ORDER — MIDAZOLAM HCL 2 MG/2ML IJ SOLN
INTRAMUSCULAR | Status: AC
  Filled 2022-09-26: qty 2

## 2022-09-26 MED ORDER — TRAMADOL HCL 50 MG PO TABS: 50.0000 mg | ORAL_TABLET | Freq: Four times a day (QID) | ORAL | Status: DC | PRN

## 2022-09-26 MED ORDER — BUPIVACAINE-EPINEPHRINE (PF) 0.25% -1:200000 IJ SOLN
INTRAMUSCULAR | Status: DC | PRN
  Administered 2022-09-26: 20 mL

## 2022-09-26 MED ORDER — DEXAMETHASONE SODIUM PHOSPHATE 4 MG/ML IJ SOLN
INTRAMUSCULAR | Status: DC | PRN
  Administered 2022-09-26: 10 mg via INTRAVENOUS

## 2022-09-26 MED ORDER — PHENYLEPHRINE 80 MCG/ML (10ML) SYRINGE FOR IV PUSH (FOR BLOOD PRESSURE SUPPORT)
PREFILLED_SYRINGE | INTRAVENOUS | Status: AC
  Filled 2022-09-26: qty 10

## 2022-09-26 MED ORDER — CEFAZOLIN SODIUM-DEXTROSE 2-4 GM/100ML-% IV SOLN
INTRAVENOUS | Status: AC
  Filled 2022-09-26: qty 100

## 2022-09-26 MED ORDER — PHENOL 1.4 % MT LIQD: 1.0000 | OROMUCOSAL | Status: DC | PRN

## 2022-09-26 MED ORDER — ACETAMINOPHEN 500 MG PO TABS
ORAL_TABLET | ORAL | Status: AC
  Administered 2022-09-26: 1000 mg via ORAL
  Filled 2022-09-26: qty 2

## 2022-09-26 MED ORDER — PROPOFOL 10 MG/ML IV BOLUS
INTRAVENOUS | Status: DC | PRN
  Administered 2022-09-26: 100 mg via INTRAVENOUS
  Administered 2022-09-26: 40 mg via INTRAVENOUS

## 2022-09-26 MED ORDER — PROPOFOL 1000 MG/100ML IV EMUL
INTRAVENOUS | Status: AC
  Filled 2022-09-26: qty 100

## 2022-09-26 MED ORDER — ENOXAPARIN SODIUM 40 MG/0.4ML IJ SOSY
40.0000 mg | PREFILLED_SYRINGE | INTRAMUSCULAR | Status: DC
  Administered 2022-09-27: 40 mg via SUBCUTANEOUS
  Filled 2022-09-26: qty 0.4

## 2022-09-26 MED ORDER — LATANOPROST 0.005 % OP SOLN: 1.0000 [drp] | Freq: Every day | OPHTHALMIC | Status: DC

## 2022-09-26 MED ORDER — SUGAMMADEX SODIUM 200 MG/2ML IV SOLN
INTRAVENOUS | Status: DC | PRN
  Administered 2022-09-26: 200 mg via INTRAVENOUS

## 2022-09-26 MED ORDER — ONDANSETRON HCL 4 MG/2ML IJ SOLN: 4.0000 mg | Freq: Once | INTRAMUSCULAR | Status: AC

## 2022-09-26 MED ORDER — DROPERIDOL 2.5 MG/ML IJ SOLN: 0.6250 mg | Freq: Once | INTRAMUSCULAR | Status: DC | PRN

## 2022-09-26 MED ORDER — LIDOCAINE HCL (CARDIAC) PF 100 MG/5ML IV SOSY
PREFILLED_SYRINGE | INTRAVENOUS | Status: DC | PRN
  Administered 2022-09-26: 100 mg via INTRAVENOUS

## 2022-09-26 MED ORDER — BISACODYL 10 MG RE SUPP
10.0000 mg | Freq: Once | RECTAL | Status: AC
  Administered 2022-09-27: 10 mg via RECTAL
  Filled 2022-09-26: qty 1

## 2022-09-26 MED ORDER — FENTANYL CITRATE (PF) 100 MCG/2ML IJ SOLN
INTRAMUSCULAR | Status: AC
  Administered 2022-09-26: 25 ug via INTRAVENOUS
  Filled 2022-09-26: qty 2

## 2022-09-26 MED ORDER — PROMETHAZINE HCL 25 MG/ML IJ SOLN: 6.2500 mg | INTRAMUSCULAR | Status: DC | PRN

## 2022-09-26 MED ORDER — FENTANYL CITRATE (PF) 100 MCG/2ML IJ SOLN
INTRAMUSCULAR | Status: AC
  Filled 2022-09-26: qty 2

## 2022-09-26 MED ORDER — CHLORHEXIDINE GLUCONATE 0.12 % MT SOLN
OROMUCOSAL | Status: AC
  Administered 2022-09-26: 15 mL via OROMUCOSAL
  Filled 2022-09-26: qty 15

## 2022-09-26 MED ORDER — BUPIVACAINE-EPINEPHRINE (PF) 0.25% -1:200000 IJ SOLN
INTRAMUSCULAR | Status: AC
  Filled 2022-09-26: qty 30

## 2022-09-26 MED ORDER — ACETAMINOPHEN 500 MG PO TABS
1000.0000 mg | ORAL_TABLET | Freq: Three times a day (TID) | ORAL | Status: DC
  Administered 2022-09-26 – 2022-09-27 (×3): 1000 mg via ORAL
  Filled 2022-09-26 (×3): qty 2

## 2022-09-26 SURGICAL SUPPLY — 37 items
BIT DRILL CANN LRG QC 5X300 (BIT) IMPLANT
BNDG COHESIVE 6X5 TAN ST LF (GAUZE/BANDAGES/DRESSINGS) ×1 IMPLANT
CHLORAPREP W/TINT 26 (MISCELLANEOUS) ×2 IMPLANT
DERMABOND ADVANCED .7 DNX12 (GAUZE/BANDAGES/DRESSINGS) ×1 IMPLANT
DRAPE 3/4 80X56 (DRAPES) ×2 IMPLANT
DRAPE C-ARM XRAY 36X54 (DRAPES) ×1 IMPLANT
DRAPE C-ARMOR (DRAPES) IMPLANT
DRAPE INCISE IOBAN 66X60 STRL (DRAPES) IMPLANT
DRAPE POUCH INSTRU U-SHP 10X18 (DRAPES) ×1 IMPLANT
DRSG OPSITE POSTOP 3X4 (GAUZE/BANDAGES/DRESSINGS) IMPLANT
ELECT REM PT RETURN 9FT ADLT (ELECTROSURGICAL) ×1
ELECTRODE REM PT RTRN 9FT ADLT (ELECTROSURGICAL) ×1 IMPLANT
GLOVE PROTEXIS LATEX SZ 7.5 (GLOVE) ×1 IMPLANT
GLOVE SURG LATEX 7.5 PF (GLOVE) ×3 IMPLANT
GOWN STRL REUS W/ TWL LRG LVL3 (GOWN DISPOSABLE) ×1 IMPLANT
GOWN STRL REUS W/ TWL XL LVL3 (GOWN DISPOSABLE) ×1 IMPLANT
GOWN STRL REUS W/TWL LRG LVL3 (GOWN DISPOSABLE) ×1
GOWN STRL REUS W/TWL XL LVL3 (GOWN DISPOSABLE) ×1
GUIDEWIRE THREADED 2.8 (WIRE) IMPLANT
MANIFOLD NEPTUNE II (INSTRUMENTS) ×1 IMPLANT
NDL SPNL 20GX3.5 QUINCKE YW (NEEDLE) ×1 IMPLANT
NEEDLE SPNL 20GX3.5 QUINCKE YW (NEEDLE) IMPLANT
PACK HIP COMPR (MISCELLANEOUS) ×1 IMPLANT
SCALPEL PROTECTED #10 DISP (BLADE) ×1 IMPLANT
SCREW CANN 16 THRD/100 7.3 (Screw) IMPLANT
SCREW CANN 16 THRD/105 7.3 (Screw) IMPLANT
SCREW CANN 16 THRD/90 7.3 (Screw) IMPLANT
SCREW CANN 16 THRD/95 7.3 (Screw) IMPLANT
SOLUTION IRRIG SURGIPHOR (IV SOLUTION) ×1 IMPLANT
SUT DVC VLOC 90 3-0 CV23 UNDY (SUTURE) IMPLANT
SUT VIC AB 0 CT1 36 (SUTURE) ×1 IMPLANT
SUT VIC AB 2-0 CT2 27 (SUTURE) ×1 IMPLANT
SYR BULB IRRIG 60ML STRL (SYRINGE) ×1 IMPLANT
TOWEL OR 17X26 4PK STRL BLUE (TOWEL DISPOSABLE) ×1 IMPLANT
TRAP FLUID SMOKE EVACUATOR (MISCELLANEOUS) ×1 IMPLANT
WASHER FOR 5.0 SCREWS (Washer) IMPLANT
WATER STERILE IRR 1000ML POUR (IV SOLUTION) ×1 IMPLANT

## 2022-09-26 NOTE — Transfer of Care (Signed)
Immediate Anesthesia Transfer of Care Note  Patient: Tyrone Ortega  Procedure(s) Performed: Left femoral neck pinning (Left: Hip)  Patient Location: PACU  Anesthesia Type:General  Level of Consciousness: drowsy  Airway & Oxygen Therapy: Patient Spontanous Breathing and Patient connected to face mask oxygen  Post-op Assessment: Report given to RN and Post -op Vital signs reviewed and stable  Post vital signs: Reviewed and stable  Last Vitals:  Vitals Value Taken Time  BP 127/82 09/26/22 1215  Temp 35.9 1215  Pulse 72 09/26/22 1216  Resp 17 09/26/22 1216  SpO2 98 % 09/26/22 1216  Vitals shown include unvalidated device data.  Last Pain:  Vitals:   09/26/22 0857  TempSrc: Temporal  PainSc: 7          Complications: No notable events documented.

## 2022-09-26 NOTE — Anesthesia Procedure Notes (Signed)
Epidural Patient location during procedure: OR Start time: 09/26/2022 11:03 AM End time: 09/26/2022 11:03 AM  Staffing Anesthesiologist: Iran Ouch, MD Performed: anesthesiologist   Preanesthetic Checklist Completed: patient identified, IV checked, site marked, risks and benefits discussed, surgical consent, monitors and equipment checked, pre-op evaluation and timeout performed  Epidural Patient position: sitting Prep: ChloraPrep Patient monitoring: heart rate, continuous pulse ox and blood pressure Approach: midline Location: L3-L4 Injection technique: LOR saline  Needle:  Needle type: Tuohy  Catheter type: closed end  Additional Notes Aborted epidural due to difficult placement. Attempt made at L3/4, L2/3 and left paramedian. No paresthesias noted. Reason for block:procedure for pain

## 2022-09-26 NOTE — Anesthesia Procedure Notes (Signed)
Procedure Name: Intubation Date/Time: 09/26/2022 10:59 AM  Performed by: Cammie Sickle, CRNAPre-anesthesia Checklist: Patient identified, Patient being monitored, Timeout performed, Emergency Drugs available and Suction available Patient Re-evaluated:Patient Re-evaluated prior to induction Oxygen Delivery Method: Circle system utilized Preoxygenation: Pre-oxygenation with 100% oxygen Induction Type: IV induction Ventilation: Mask ventilation without difficulty Laryngoscope Size: 3 and McGraph Grade View: Grade I Tube type: Oral Tube size: 7.0 mm Number of attempts: 1 Airway Equipment and Method: Stylet Placement Confirmation: ETT inserted through vocal cords under direct vision, positive ETCO2 and breath sounds checked- equal and bilateral Secured at: 22 cm Tube secured with: Tape Dental Injury: Teeth and Oropharynx as per pre-operative assessment

## 2022-09-26 NOTE — Op Note (Signed)
Patient Name: Tyrone Ortega  OIZ:[id number]   Pre-Operative Diagnosis: Left hip impacted femoral neck fracture  Post-Operative Diagnosis: (same)  Procedure: Left hip closed reduction, percutaneous pinning  Components/Implants: x3 cannulated synthes hip screw 38m threaded  Date of Surgery: 09/26/2022  Surgeon: ZSteffanie RainwaterMD  Assistant: TDorise HissPA   Anesthesiologist: JWynetta Emery  Anesthesia: General   EBL: 50  IVF 6338 Complications: None   Brief history: The patient is a 76year old male who presented to the office on 09/23/2022 with an impacted left hip fracture which she had been walking on and attempting to stay off for approximately 10 days before finding out that it was broken.  The decision was made to go forward with a left hip closed reduction percutaneous pinning if the fracture had not moved at the time of surgery.  After positioning and prior to prepping x-ray was taken which showed that the fracture had remained unchanged in its impacted form since the office visit on 09/23/2022.  The risks and benefits of closed reduction percutaneous pinning of the hip were discussed with the family and patient and they opted to proceed with the operation. All preoperative films were reviewed and an appropriate surgical plan was made prior to surgery.   Description of procedure: The patient was brought to the operating room where laterality was confirmed by all those present to be the left side.  The patient was administered general anesthesia on a stretcher prior to being moved supine on the operating room table. Patient was given an intravenous dose of antibiotics for surgical prophylaxis and TXA.  All bony prominences and extremities were well padded and the patient was securely attached to the table boots, a perineal post was placed and the patient had a safety strap placed.  Surgical site was prepped with alcohol and chlorhexidine. The surgical site over the hip was and draped in  typical sterile fashion with multiple layers of adhesive and nonadhesive drapes.     A surgical timeout was then called with participation of all staff in the room the patient was then a confirmed again and laterality confirmed.  A 2 cm incision was made over the lateral aspect of the hip under fluoroscopic navigation 3 percutaneous wires were driven into the femoral neck and head in an inverted triangle shape utilizing a targeting jig.  The 3's pins were found to be parallel and within bone on AP and lateral x-rays.  The lateral cortex of each pin site was then opened with a opening drill starting with an inferior screw.  A 105 mm screw was screwed into the inferior femoral neck and head with a washer .  Subsequently a anterior 90 mm screw and a posterior 95 mm screw were then implanted into the anterior superior and posterior superior femoral neck and head .  X-rays were meticulously checked in AP flexion external rotation and lateral confirming the screws were inside of bone and parallel .  The wires were then removed and the position of the screws was checked again .  The incision was then irrigated with saline the soft tissue was injected with quarter percent Marcaine.  The IT band and fascia were closed with 0 Vicryl and the skin was closed with 2-0 Vicryl and 3-0 barbed Monocryl suture .  Skin was then closed with Dermabond and a sterile dressing was applied .   The patient was awoken from anesthesia transferred off of the operating room table onto a hospital bed where examination of leg  lengths found the leg lengths to be equal.  The patient was then transferred to the PACU in stable condition.

## 2022-09-26 NOTE — Interval H&P Note (Signed)
History and Physical Interval Note:  09/26/2022 10:29 AM  Tyrone Ortega  has presented today for surgery, with the diagnosis of Left hip pain  M25.552 Displaced fracture of left femoral neck  S72.002A.  The various methods of treatment have been discussed with the patient and family. After consideration of risks, benefits and other options for treatment, the patient has consented to  Procedure(s): Left femoral neck pinning with possible anterior total hip arthroplasty (Left) [place] (Left) as a surgical intervention.  The patient's history has been reviewed, patient examined, no change in status, stable for surgery.  I have reviewed the patient's chart and labs.  Questions were answered to the patient's satisfaction.     Steffanie Rainwater

## 2022-09-26 NOTE — Anesthesia Postprocedure Evaluation (Signed)
Anesthesia Post Note  Patient: Tyrone Ortega  Procedure(s) Performed: Left femoral neck pinning (Left: Hip)  Patient location during evaluation: PACU Anesthesia Type: General Level of consciousness: awake and alert Pain management: pain level controlled Vital Signs Assessment: post-procedure vital signs reviewed and stable Respiratory status: spontaneous breathing, nonlabored ventilation and respiratory function stable Cardiovascular status: blood pressure returned to baseline and stable Postop Assessment: no apparent nausea or vomiting Anesthetic complications: no   No notable events documented.   Last Vitals:  Vitals:   09/26/22 1325 09/26/22 1335  BP: 101/68 (!) 116/56  Pulse: 66 66  Resp: 16 18  Temp:  36.6 C  SpO2: 91% 94%    Last Pain:  Vitals:   09/26/22 1335  TempSrc: Oral  PainSc:                  [place] Ouch

## 2022-09-26 NOTE — H&P (Signed)
Chief Complaint  Patient presents with   Left hip fracture      Tyrone Ortega is a 76 y.o. male who presents today for evaluation of left hip pain.  [place], he fell [place] a flight of steps and injured his left hip.  He was seen by PCP 09/21/2022 and underwent x-rays of the left hip showing a impacted femoral neck fracture.  He has been taking meloxicam and tramadol twice daily.  He has been using a walker and has been nonweightbearing to the left lower extremity.  He has been at home with family who have been helping him.  Patient's pain is moderate to severe located in his left groin and thigh. He denies any numbness tingling.  No swelling throughout the lower leg.  Denies any other injury from his fall.   Patient has no history of blood clots.  He is not diabetic.       Past Medical History:     Past Medical History:  Diagnosis Date   Acquired hypothyroidism     Glaucoma (increased eye pressure)     History of cataract left eye removed 1990    right eye removed 1997   HOH (hard of hearing)     Hypertension diagnosed 02/28/2008   Retinitis pigmentosa        Past Surgical History:      Past Surgical History:  Procedure Laterality Date   COLONOSCOPY   05/06/2008    Dr. 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 [facility]   lipoma excision Left 10/2018    left anterior chest wall   COLONOSCOPY   07/13/2021    Tubular adenoma/TVA/Repeat 18yr/TKT   CATARACT EXTRACTION Bilateral     Rt leg varicose vins       TONSILLECTOMY          Past Family History: Family History       Family History  Problem Relation Age of Onset   Kidney cancer Mother     Glaucoma Mother     Hip fracture Mother     Coronary Artery Disease (Blocked arteries around heart) Father     Diabetes type II Brother     Breast cancer Sister          Passed away 2March 31, 2012      Medications:        Current Outpatient Medications Ordered in Epic  Medication Sig Dispense Refill   traMADoL (ULTRAM) 50 mg tablet Take 1 tablet (50 mg total) by mouth every 6 (six) hours as needed for Pain for up to 15 days 30 tablet 2   dorzolamide (TRUSOPT) 2 % ophthalmic solution Place 1 drop into both eyes 2 (two) times daily (Patient not taking: Reported on 09/21/2022)       ergocalciferol, vitamin D2, 1,250 mcg (50,000 unit) capsule Take 1 capsule (50,000 Units total) by mouth every 7 (seven) days (Patient taking differently: Take 50,000 Units by mouth every 14 (fourteen) days) 13 capsule 3   latanoprost (XALATAN) 0.005 % ophthalmic solution Place 1 drop into both eyes nightly.       lisinopriL (ZESTRIL) 40 MG tablet Take 1 tablet (40 mg total) by mouth once daily 90 tablet 3   miscellaneous medical supply Misc Use 1 each once for 1 dose Wheel chair with leg attachments 1 each 0   miscellaneous medical  supply Misc Use 1 each once for 1 dose Wheelchair with leg attachments for left hip fracture 1 each 0    No current Epic-ordered facility-administered medications on file.      Allergies:     Allergies  Allergen Reactions   Codeine Nausea And Vomiting   Oxycodone Nausea And Vomiting and Rash      Review of Systems:  A comprehensive 14 point ROS was performed, reviewed by me today, and the pertinent orthopaedic findings are documented in the HPI.   Exam: BP (!) 140/92   Temp 36.7 C (98.1 F)   Ht 185.4 cm ('6\' 1"'$ )   Wt 87.5 kg (193 lb)   SpO2 95%   BMI 25.46 kg/m  General:  Well developed, well nourished, no apparent distress, normal affect, normal gait with no antalgic component.    HEENT: Head normocephalic, atraumatic, PERRL.    Abdomen: Soft, non tender, non distended, Bowel sounds present.   Heart: Examination of the heart reveals regular, rate, and rhythm.  There is no murmur noted on ascultation.  There is a normal apical pulse.   Lungs: Lungs are clear to auscultation.  There  is no wheeze, rhonchi, or crackles.  There is normal expansion of bilateral chest walls.      Left hip: Examination of the left hip shows patient is tender to palpation along the lateral hip and groin.  No significant swelling bruising.  No skin discoloration.  Minimal swelling throughout the thigh with very mild tenderness.  Compartments are soft.  He is able to straight leg raise at the knee and has normal ankle plantarflexion dorsiflexion.  Dorsalis pedis pulses are intact.     AP and crosstable views of the left hip are ordered interpreted by me in the office today.  Impression: Patient has an impacted femoral neck fracture with minimal increase in displacement compared to previous x-rays from 09/21/2022.  There is no offset of the femoral head on the neck seen on the lateral view.  There is no evidence of pelvic fracture.  No or evidence of acute bony abnormality or abnormal bony lesions     Impression: Left hip pain [M25.552] Left hip pain  (primary encounter diagnosis) Displaced fracture of left femoral neck (CMS-HCC)   Plan:  65.  76 year old male with fall and injury to his left hip on 09/13/2022.  Patient was found to have a left hip impacted femoral neck fracture on 09/21/2022.  Patient comes in today for evaluation, his pain has been controlled with using a walker, attempting to maintain nonweightbearing as well as using tramadol twice a day and meloxicam.  Patient does not report any other injury or pain to his body.  X-rays , show minimal increase in impaction of the left femoral neck fracture.  Risks, benefits, complications of a left hip pinning and possible left total hip arthroplasty pending x-rays in the OR have been discussed with the patient.  Patient has agreed and consented to the procedure with Dr. Karel Jarvis on Monday, 09/26/2022.  We discussed the importance of staying off of his hip to prevent motion of the fracture and the risks and benefits of coming in emergently versus coming  in an elective fashion for surgery. They patient and family have decided to come in from outpatient for the surgery and not be admitted through the emergency room. I discussed the potential for the fracture to move and him need a full hip replacement and that the determination would be made in the operating  room after getting new xrays. The risks and benefits of surgery, ORIF and THA were discussed including infection, dislocation, damage to nerves and vessels, need for revision, failure of ORIF and possible need to convert to THA in the future. We discussed blood clots, PE, and death and unlikely but potential risk factors for the surgery. All questions were answered and the patient and family agreed with the plan.   Steffanie Rainwater MD

## 2022-09-27 ENCOUNTER — Encounter: Payer: Self-pay | Admitting: Orthopedic Surgery

## 2022-09-27 LAB — CBC
HCT: 37.2 % — ABNORMAL LOW (ref 39.0–52.0)
Hemoglobin: 13 g/dL (ref 13.0–17.0)
MCH: 30.2 pg (ref 26.0–34.0)
MCHC: 34.9 g/dL (ref 30.0–36.0)
MCV: 86.3 fL (ref 80.0–100.0)
Platelets: 232 10*3/uL (ref 150–400)
RBC: 4.31 MIL/uL (ref 4.22–5.81)
RDW: 12.2 % (ref 11.5–15.5)
WBC: 15.3 10*3/uL — ABNORMAL HIGH (ref 4.0–10.5)
nRBC: 0 % (ref 0.0–0.2)

## 2022-09-27 LAB — CREATININE, SERUM
Creatinine, Ser: 1.31 mg/dL — ABNORMAL HIGH (ref 0.61–1.24)
GFR, Estimated: 57 mL/min — ABNORMAL LOW (ref >60)

## 2022-09-27 MED ORDER — LISINOPRIL 20 MG PO TABS: 40.0000 mg | ORAL_TABLET | Freq: Every day | ORAL | Status: DC

## 2022-09-27 MED ORDER — DOCUSATE SODIUM 100 MG PO CAPS: 100.0000 mg | ORAL_CAPSULE | Freq: Two times a day (BID) | ORAL | 0 refills | Status: AC

## 2022-09-27 MED ORDER — ENOXAPARIN SODIUM 40 MG/0.4ML IJ SOSY: 40.0000 mg | PREFILLED_SYRINGE | INTRAMUSCULAR | 0 refills | Status: AC

## 2022-09-27 MED ORDER — TRAMADOL HCL 50 MG PO TABS: 25.0000 mg | ORAL_TABLET | Freq: Four times a day (QID) | ORAL | 0 refills | Status: AC | PRN

## 2022-09-27 MED ORDER — SODIUM CHLORIDE 0.9 % IV BOLUS
250.0000 mL | Freq: Once | INTRAVENOUS | Status: AC
  Administered 2022-09-27: 250 mL via INTRAVENOUS

## 2022-09-27 MED ORDER — CELECOXIB 100 MG PO CAPS: 100.0000 mg | ORAL_CAPSULE | Freq: Two times a day (BID) | ORAL | 0 refills | Status: AC

## 2022-09-27 MED ORDER — ONDANSETRON HCL 4 MG PO TABS: 4.0000 mg | ORAL_TABLET | Freq: Four times a day (QID) | ORAL | 0 refills | Status: AC | PRN

## 2022-09-27 MED ORDER — ACETAMINOPHEN 500 MG PO TABS: 1000.0000 mg | ORAL_TABLET | Freq: Three times a day (TID) | ORAL | 0 refills | Status: AC

## 2022-09-27 NOTE — Plan of Care (Signed)
  Problem: Education: Goal: Verbalization of understanding the information provided (i.e., activity precautions, restrictions, etc) will improve Outcome: Progressing Goal: Individualized Educational Video(s) Outcome: Progressing   Problem: Activity: Goal: Ability to ambulate and perform ADLs will improve Outcome: Progressing   Problem: Clinical Measurements: Goal: Postoperative complications will be avoided or minimized Outcome: Progressing   Problem: Self-Concept: Goal: Ability to maintain and perform role responsibilities to the fullest extent possible will improve Outcome: Progressing   Problem: Pain Management: Goal: Pain level will decrease Outcome: Progressing   Problem: Education: Goal: Knowledge of General Education information will improve Description: Including pain rating scale, medication(s)/side effects and non-pharmacologic comfort measures Outcome: Progressing   Problem: Health Behavior/Discharge Planning: Goal: Ability to manage health-related needs will improve Outcome: Progressing   Problem: Clinical Measurements: Goal: Ability to maintain clinical measurements within normal limits will improve Outcome: Progressing Goal: Will remain free from infection Outcome: Progressing Goal: Diagnostic test results will improve Outcome: Progressing Goal: Respiratory complications will improve Outcome: Progressing Goal: Cardiovascular complication will be avoided Outcome: Progressing   Problem: Activity: Goal: Risk for activity intolerance will decrease Outcome: Progressing   Problem: Nutrition: Goal: Adequate nutrition will be maintained Outcome: Progressing   Problem: Coping: Goal: Level of anxiety will decrease Outcome: Progressing   Problem: Elimination: Goal: Will not experience complications related to bowel motility Outcome: Progressing Goal: Will not experience complications related to urinary retention Outcome: Progressing   Problem: Pain  Managment: Goal: General experience of comfort will improve Outcome: Progressing   Problem: Safety: Goal: Ability to remain free from injury will improve Outcome: Progressing   Problem: Skin Integrity: Goal: Risk for impaired skin integrity will decrease Outcome: Progressing   Problem: Education: Goal: Knowledge of the prescribed therapeutic regimen will improve Outcome: Progressing Goal: Understanding of discharge needs will improve Outcome: Progressing Goal: Individualized Educational Video(s) Outcome: Progressing   Problem: Activity: Goal: Ability to avoid complications of mobility impairment will improve Outcome: Progressing Goal: Ability to tolerate increased activity will improve Outcome: Progressing   Problem: Clinical Measurements: Goal: Postoperative complications will be avoided or minimized Outcome: Progressing   Problem: Pain Management: Goal: Pain level will decrease with appropriate interventions Outcome: Progressing   Problem: Skin Integrity: Goal: Will show signs of wound healing Outcome: Progressing

## 2022-09-27 NOTE — Progress Notes (Addendum)
   Subjective: 1 Day Post-Op Procedure(s) (LRB): Left femoral neck pinning (Left) Patient reports pain as mild.   Patient is well, and has had no acute complaints or problems Denies any CP, SOB, ABD pain. We will continue therapy today.  Plan is to go Home after hospital stay.  Objective: Vital signs in last 24 hours: Temp:  [96.9 F (36.1 C)-98.5 F (36.9 C)] 98.4 F (36.9 C) (10/02 2340) Pulse Rate:  [62-81] 70 (10/02 2340) Resp:  [10-26] 18 (10/02 2340) BP: (96-134)/(49-82) 109/68 (10/02 2340) SpO2:  [91 %-100 %] 94 % (10/02 2340) Weight:  [87.1 kg] 87.1 kg (10/02 0857)  Intake/Output from previous day: 10/02 0701 - 10/03 0700 In: 1460 [P.O.:120; I.V.:1040; IV Piggyback:300] Out: 1120 [Urine:1070; Blood:50] Intake/Output this shift: No intake/output data recorded.  Recent Labs    09/26/22 0853 09/27/22 0510  HGB 15.7 13.0   Recent Labs    09/26/22 0853 09/27/22 0510  WBC 10.0 15.3*  RBC 5.20 4.31  HCT 45.2 37.2*  PLT 267 232   Recent Labs    09/26/22 0853 09/27/22 0510  NA 132*  --   K 3.8  --   CL 101  --   CO2 26  --   BUN 19  --   CREATININE 1.12 1.31*  GLUCOSE 109*  --   CALCIUM 8.9  --    No results for input(s): "LABPT", "INR" in the last 72 hours.  EXAM General - Patient is Alert, Appropriate, and Oriented Extremity - Neurovascular intact Sensation intact distally Intact pulses distally Dorsiflexion/Plantar flexion intact Dressing - dressing C/D/I and no drainage Motor Function - intact, moving foot and toes well on exam.   Past Medical History:  Diagnosis Date   Decreased hearing    Hypertension    RP (retinitis pigmentosa)     Assessment/Plan:   1 Day Post-Op Procedure(s) (LRB): Left femoral neck pinning (Left) Principal Problem:   Fracture of femoral neck, left, closed (HCC)  Estimated body mass index is 25.33 kg/m as calculated from the following:   Height as of this encounter: '6\' 1"'$  (1.854 m).   Weight as of this  encounter: 87.1 kg. Advance diet Up with therapy 50% weightbearing left lower extremity x2 weeks Pain well controlled.  Vital signs are stable.  Blood pressure soft.  Patient asymptomatic does not report any dizziness, lightheadedness.  We will hold lisinopril this morning.  Encourage patient to increase p.o. fluids.  Labs are stable  Care management to assist with discharge to home with home health PT today pending completion of PT goals     DVT Prophylaxis - Lovenox, TED hose, and SCDs 50% weightbearing left lower extremity x2 weeks   T. Rachelle Hora, PA-C [place] 09/27/2022, 8:05 AM   Patient seen and examined, agree with above plan.  The patient is doing well status post left hip closed reduction percutaneous pinning, no concerns at this time.  Pain is controlled.  Discussed the importance of partial weightbearing on the left leg until follow-up also discussed DVT prophylaxis, pain medication use, and safe transition to home.  All questions answered the patient agrees with above plan will likely go  home after clears PT.    Steffanie Rainwater MD

## 2022-09-27 NOTE — Evaluation (Signed)
Occupational Therapy Evaluation Patient Details Name: Tyrone Ortega MRN: [id number] DOB: January 11, 1946 Today's Date: 09/27/2022   History of Present Illness Tyrone Ortega GENERAL presented with c/o fall 10 days prior to admission. He was found to have impacted left femoral neck fracture. He is now s/p Left hip closed reduction, percutaneous pinning. PMH includes: HTN, Glaucoma, and HOH.   Clinical Impression   [name] was seen for OT evaluation this date, POD#1 from above surgery. Pt was modified independent in all ADLs prior to his fall. He endorses using a RW for functional mobility and baseline low vision. Pt reports falling down a flight of stairs while on vacation. He denies additional falls history in last year. Pt is eager to return to PLOF with less pain and improved safety and independence. Pt currently requires minimal assist for LB dressing while in seated position due to pain and limited AROM of his LLE. Pt instructed in falls prevention strategies, home/routines modifications, DME/AE for LB bathing and dressing tasks, and compression stocking mgt. Pt would benefit from skilled OT services including additional instruction in dressing techniques with or without assistive devices for dressing and bathing skills to support recall and carryover prior to discharge and ultimately to maximize safety, independence, and minimize falls risk and caregiver burden. Do not currently anticipate any OT needs following this hospitalization.         Recommendations for follow up therapy are one component of a multi-disciplinary discharge planning process, led by the attending physician.  Recommendations may be updated based on patient status, additional functional criteria and insurance authorization.   Follow Up Recommendations  No OT follow up    Assistance Recommended at Discharge Set up Supervision/Assistance  Patient can return home with the following A little help with walking and/or transfers;A little help  with bathing/dressing/bathroom    Functional Status Assessment  Patient has had a recent decline in their functional status and demonstrates the ability to make significant improvements in function in a reasonable and predictable amount of time.  Equipment Recommendations  None recommended by OT (Pt has necessary equipment)    Recommendations for Other Services       Precautions / Restrictions Precautions Precautions: Fall Restrictions Weight Bearing Restrictions: Yes LLE Weight Bearing: Partial weight bearing LLE Partial Weight Bearing Percentage or Pounds: 50% per MD      Mobility Bed Mobility               General bed mobility comments: deferred. Pt in recliner at start/end of session.    Transfers Overall transfer level: Needs assistance Equipment used: Rolling walker (2 wheels) Transfers: Sit to/from Stand Sit to Stand: Supervision                  Balance Overall balance assessment: Needs assistance Sitting-balance support: No upper extremity supported, Feet supported Sitting balance-Leahy Scale: Normal     Standing balance support: During functional activity, No upper extremity supported Standing balance-Leahy Scale: Good                             ADL either performed or assessed with clinical judgement   ADL Overall ADL's : Needs assistance/impaired                                       General ADL Comments: Pt functionally limited by increased pain  and PWB status on LLE. He requires SUPERVISION for safety during functional mobility, simulated toilet transfer, and standing grooming tasks. Min cueing for adherence to [facility] status.     Vision Baseline Vision/History: 2 Legally blind;1 Wears glasses Patient Visual Report: No change from baseline       Perception     Praxis      Pertinent Vitals/Pain Pain Assessment Pain Assessment: 0-10 Pain Score: 0-No pain Pain Intervention(s): Monitored during session      Hand Dominance Right   Extremity/Trunk Assessment Upper Extremity Assessment Upper Extremity Assessment: Overall WFL for tasks assessed   Lower Extremity Assessment Lower Extremity Assessment: Overall WFL for tasks assessed;Defer to PT evaluation       Communication     Cognition Arousal/Alertness: Awake/alert Behavior During Therapy: WFL for tasks assessed/performed Overall Cognitive Status: Within Functional Limits for tasks assessed                                       General Comments       Exercises Other Exercises Other Exercises: Pt educated on role of OT in acute setting, safe use of AE/DME for ADL management, compensatory ADL management techniques in setting of 50% PWB status, and routines modifications to support safety and functional independence during ADL management.   Shoulder Instructions      Home Living Family/patient expects to be discharged to:: Private residence Living Arrangements: Spouse/significant other Available Help at Discharge: Family;Friend(s);Available 24 hours/day Type of Home: House Home Access: Stairs to enter [employer] of Steps: 1   Home Layout: One level     Bathroom Shower/Tub: Occupational psychologist: Standard     Home Equipment: Shower seat;Hand held Engineering geologist (2 wheels)          Prior Functioning/Environment Prior Level of Function : Independent/Modified Independent             Mobility Comments: Using RW for all mobility. Denies additional falls history. ADLs Comments: Mod I for ADL managment. Spouse drives to doctors appointments, etc.        OT Problem List: Decreased strength;Decreased coordination;Decreased activity tolerance;Decreased safety awareness;Impaired balance (sitting and/or standing);Decreased knowledge of use of DME or AE      OT Treatment/Interventions: Self-care/ADL training;Therapeutic exercise;Therapeutic activities;DME and/or AE  instruction;Balance training;Patient/family education    OT Goals(Current goals can be found in the care plan section) Acute Rehab OT Goals Patient Stated Goal: To go home OT Goal Formulation: With patient Time For Goal Achievement: 10/11/22 Potential to Achieve Goals: Good  OT Frequency: Min 2X/week    Co-evaluation              AM-PAC OT "6 Clicks" Daily Activity     Outcome Measure Help from another person eating meals?: None Help from another person taking care of personal grooming?: None Help from another person toileting, which includes using toliet, bedpan, or urinal?: A Little Help from another person bathing (including washing, rinsing, drying)?: A Little Help from another person to put on and taking off regular upper body clothing?: A Little Help from another person to put on and taking off regular lower body clothing?: A Little 6 Click Score: 20   End of Session Equipment Utilized During Treatment: Gait belt;Rolling walker (2 wheels) Nurse Communication: Mobility status;Weight bearing status  Activity Tolerance: Patient tolerated treatment well Patient left: in chair;with call bell/phone within reach;with chair  alarm set  OT Visit Diagnosis: Other abnormalities of gait and mobility (R26.89);Pain Pain - Right/Left: Left Pain - part of body: Hip                Time: 1859-0931 OT Time Calculation (min): 23 min Charges:  OT General Charges $OT Visit: 1 Visit OT Evaluation $OT Eval Low Complexity: 1 Low OT Treatments $Self Care/Home Management : 8-22 mins  Shara Blazing, M.S., OTR/L Ascom: [phone] 09/27/22, 10:31 AM

## 2022-09-27 NOTE — Evaluation (Addendum)
Physical Therapy Evaluation Patient Details Name: Tyrone Ortega MRN: [id number] DOB: 04/17/1946 Today's Date: 09/27/2022  History of Present Illness  Nels RAYBON CONARD presented with c/o fall 10 days prior to admission. He was found to have impacted left femoral neck fracture. He is now s/p Left hip closed reduction, percutaneous pinning. PMH includes: HTN, Glaucoma, and HOH.   Clinical Impression  Patient alert, agreeable to PT, up in recliner at start and end of session. Denied pain and able to verbalize PWB precautions. He was able to perform transfers with supervision, ambulate ~280f with CGA-supervision, great adherence to precautions. He was also able to perform stair navigation twice to enforce safe technique, pt able to teach back.  PT and pt also reviewed bathroom safety and set up, as well as car transfers. Overall the patient demonstrated deficits (see "PT Problem List") that impede the patient's functional abilities, safety, and mobility and would benefit from skilled PT intervention. Recommendation at this time is HHPT to maximize function, safety, and return to PLOF as able.         Recommendations for follow up therapy are one component of a multi-disciplinary discharge planning process, led by the attending physician.  Recommendations may be updated based on patient status, additional functional criteria and insurance authorization.  Follow Up Recommendations Home health PT      Assistance Recommended at Discharge Intermittent Supervision/Assistance  Patient can return home with the following  Assistance with cooking/housework;Assist for transportation;Help with stairs or ramp for entrance;Direct supervision/assist for medications management    Equipment Recommendations None recommended by PT  Recommendations for Other Services       Functional Status Assessment Patient has had a recent decline in their functional status and demonstrates the ability to make significant  improvements in function in a reasonable and predictable amount of time.     Precautions / Restrictions Precautions Precautions: Fall Restrictions Weight Bearing Restrictions: Yes LLE Weight Bearing: Partial weight bearing LLE Partial Weight Bearing Percentage or Pounds: 50% per MD      Mobility  Bed Mobility               General bed mobility comments: deferred. Pt in recliner at start/end of session.    Transfers Overall transfer level: Needs assistance Equipment used: Rolling walker (2 wheels) Transfers: Sit to/from Stand Sit to Stand: Supervision                Ambulation/Gait Ambulation/Gait assistance: Supervision Gait Distance (Feet): 220 Feet Assistive device: Rolling walker (2 wheels)         General Gait Details: good adherence to NWB precautions (~90% of the time). no LOB, steady  Stairs Stairs: Yes Stairs assistance: Min guard Stair Management: With walker, No rails Number of Stairs: 1 General stair comments: performed twice to enforce safe technique, pt able to demonstrate and verbalize technique  Wheelchair Mobility    Modified Rankin (Stroke Patients Only)       Balance Overall balance assessment: Needs assistance Sitting-balance support: No upper extremity supported, Feet supported Sitting balance-Leahy Scale: Normal     Standing balance support: During functional activity, No upper extremity supported Standing balance-Leahy Scale: Good                               Pertinent Vitals/Pain Pain Assessment Pain Assessment: No/denies pain    Home Living Family/patient expects to be discharged to:: Private residence Living Arrangements: Spouse/significant other Available Help  at Discharge: Family;Friend(s);Available 24 hours/day Type of Home: House Home Access: Stairs to enter   [employer] of Steps: 1   Home Layout: One level Home Equipment: Shower seat;Hand held Engineering geologist (2  wheels)      Prior Function Prior Level of Function : Independent/Modified Independent             Mobility Comments: Using RW for all mobility. Denies additional falls history. ADLs Comments: Mod I for ADL managment. Spouse drives to doctors appointments, etc.     Hand Dominance   Dominant Hand: Right    Extremity/Trunk Assessment   Upper Extremity Assessment Upper Extremity Assessment: Overall WFL for tasks assessed    Lower Extremity Assessment Lower Extremity Assessment: Overall WFL for tasks assessed       Communication   Communication: No difficulties;HOH  Cognition Arousal/Alertness: Awake/alert Behavior During Therapy: WFL for tasks assessed/performed Overall Cognitive Status: Within Functional Limits for tasks assessed                                          General Comments      Exercises     Assessment/Plan    PT Assessment Patient needs continued PT services  PT Problem List Decreased strength;Decreased mobility;Decreased range of motion;Decreased activity tolerance;Decreased balance;Pain;Decreased knowledge of use of DME;Decreased knowledge of precautions       PT Treatment Interventions DME instruction;Therapeutic exercise;Balance training;Stair training;Neuromuscular re-education;Functional mobility training;Therapeutic activities;Patient/family education    PT Goals (Current goals can be found in the Care Plan section)  Acute Rehab PT Goals Patient Stated Goal: to go home PT Goal Formulation: With patient Time For Goal Achievement: 10/11/22 Potential to Achieve Goals: Good    Frequency 7X/week     Co-evaluation               AM-PAC PT "6 Clicks" Mobility  Outcome Measure Help needed turning from your back to your side while in a flat bed without using bedrails?: None Help needed moving from lying on your back to sitting on the side of a flat bed without using bedrails?: None Help needed moving to and from a  bed to a chair (including a wheelchair)?: None Help needed standing up from a chair using your arms (e.g., wheelchair or bedside chair)?: None Help needed to walk in hospital room?: None Help needed climbing 3-5 steps with a railing? : A Little 6 Click Score: 23    End of Session Equipment Utilized During Treatment: Gait belt Activity Tolerance: Patient tolerated treatment well Patient left: in chair;with chair alarm set;with call bell/phone within reach Nurse Communication: Mobility status PT Visit Diagnosis: Other abnormalities of gait and mobility (R26.89);Difficulty in walking, not elsewhere classified (R26.2);Muscle weakness (generalized) (M62.81);Pain Pain - Right/Left: Left Pain - part of body: Hip    Time: 1049-1106 PT Time Calculation (min) (ACUTE ONLY): 17 min   Charges:   PT Evaluation $PT Eval Low Complexity: 1 Low PT Treatments $Gait Training: 8-22 mins       Lieutenant Diego PT, DPT 1:00 PM,09/27/22

## 2022-09-27 NOTE — Discharge Summary (Signed)
Physician Discharge Summary  Patient ID: Tyrone Ortega MRN: [id number] DOB/AGE: August 07, 1946 76 y.o.  Admit date: 09/26/2022 Discharge date: 09/27/2022  Admission Diagnoses:  Fracture of femoral neck, left, closed ([place]) [S72.002A]   Discharge Diagnoses: Patient Active Problem List   Diagnosis Date Noted   Fracture of femoral neck, left, closed ([place]) 09/26/2022   Bluish skin discoloration 06/24/2013   Varicose veins of lower extremities with other complications 41/66/0630    Past Medical History:  Diagnosis Date   Decreased hearing    Hypertension    RP (retinitis pigmentosa)      Transfusion: none   Consultants (if any):   Discharged Condition: Improved  Hospital Course: Tyrone Ortega is an 76 y.o. male who was admitted 09/26/2022 with a diagnosis of Fracture of femoral neck, left, closed ([place]) and went to the operating room on 09/26/2022 and underwent the above named procedures.    Surgeries: Procedure(s): Left femoral neck pinning on 09/26/2022 Patient tolerated the surgery well. Taken to PACU where she was stabilized and then transferred to the orthopedic floor.  Started on Lovenox 40 mg q 24 hrs.  SCDs and TED hose applied bilaterally.  Physical therapy started on day #1 for gait training and transfer. OT started day #1 for ADL and assisted devices.  On postop day 1 patient's blood pressure was soft.  His lisinopril was held and he was given a 250 cc bolus of normal saline.  Patient was able to complete all goals of physical therapy safely with out any symptoms of dizziness/lightheadedness. BP improved, patient felt great with no complaints. PT recommended discharge home with home health PT.  On post op day #1 patient was stable and ready for discharge to home with home health PT.  Implants: x3 cannulated synthes hip screw 77m threaded  He was given perioperative antibiotics:  Anti-infectives (From admission, onward)    Start     Dose/Rate Route Frequency Ordered Stop    09/26/22 1630  ceFAZolin (ANCEF) IVPB 2g/100 mL premix        2 g 200 mL/hr over 30 Minutes Intravenous Every 6 hours 09/26/22 1332 09/27/22 0722   09/26/22 0600  ceFAZolin (ANCEF) IVPB 2g/100 mL premix        2 g 200 mL/hr over 30 Minutes Intravenous On call to O.R. 09/25/22 2147 09/26/22 1110     .  He was given sequential compression devices, early ambulation, and Lovenox, teds for DVT prophylaxis.  He benefited maximally from the hospital stay and there were no complications.    Recent vital signs:  Vitals:   09/26/22 2340 09/27/22 0812  BP: 109/68 114/76  Pulse: 70 72  Resp: 18 18  Temp: 98.4 F (36.9 C) 97.6 F (36.4 C)  SpO2: 94% 98%    Recent laboratory studies:  Lab Results  Component Value Date   HGB 13.0 09/27/2022   HGB 15.7 09/26/2022   Lab Results  Component Value Date   WBC 15.3 (H) 09/27/2022   PLT 232 09/27/2022   No results found for: "INR" Lab Results  Component Value Date   NA 132 (L) 09/26/2022   K 3.8 09/26/2022   CL 101 09/26/2022   CO2 26 09/26/2022   BUN 19 09/26/2022   CREATININE 1.31 (H) 09/27/2022   GLUCOSE 109 (H) 09/26/2022    Discharge Medications:   Allergies as of 09/27/2022       Reactions   Codeine Nausea And Vomiting   Oxycodone Nausea And Vomiting  Medication List     STOP taking these medications    aspirin-acetaminophen-caffeine 250-250-65 MG tablet Commonly known as: EXCEDRIN MIGRAINE   COLLAGEN-VITAMIN C PO   ibuprofen 800 MG tablet Commonly known as: ADVIL   ondansetron 4 MG disintegrating tablet Commonly known as: ZOFRAN-ODT       TAKE these medications    acetaminophen 500 MG tablet Commonly known as: TYLENOL Take 2 tablets (1,000 mg total) by mouth every 8 (eight) hours.   celecoxib 100 MG capsule Commonly known as: CeleBREX Take 1 capsule (100 mg total) by mouth 2 (two) times daily.   docusate sodium 100 MG capsule Commonly known as: COLACE Take 1 capsule (100 mg total) by  mouth 2 (two) times daily.   dorzolamide 2 % ophthalmic solution Commonly known as: TRUSOPT Place 1 drop into both eyes 2 (two) times daily.   doxylamine (Sleep) 25 MG tablet Commonly known as: UNISOM Take 25 mg by mouth at bedtime as needed for sleep.   enoxaparin 40 MG/0.4ML injection Commonly known as: LOVENOX Inject 0.4 mLs (40 mg total) into the skin daily for 14 days.   latanoprost 0.005 % ophthalmic solution Commonly known as: XALATAN Place 1 drop into both eyes at bedtime.   lisinopril 40 MG tablet Commonly known as: ZESTRIL Take 40 mg by mouth daily.   ondansetron 4 MG tablet Commonly known as: ZOFRAN Take 1 tablet (4 mg total) by mouth every 6 (six) hours as needed for nausea.   traMADol 50 MG tablet Commonly known as: ULTRAM Take 0.5-1 tablets (25-50 mg total) by mouth every 6 (six) hours as needed for up to 15 days. What changed: how much to take   Vitamin D (Ergocalciferol) 1.25 MG (50000 UNIT) Caps capsule Commonly known as: DRISDOL Take 50,000 Units by mouth every Friday.        Diagnostic Studies: DG HIP UNILAT WITH PELVIS 2-3 VIEWS LEFT  Result Date: 09/26/2022 CLINICAL DATA:  Left hip ORIF.  Intraoperative fluoroscopy. EXAM: DG HIP (WITH OR WITHOUT PELVIS) 2-3V LEFT COMPARISON:  None Available. FINDINGS: Images were performed intraoperatively without the presence of a radiologist. A left femoral neck fracture is seen. Surgical instrumentation is seen placing 3 in its screws across the fracture line. Near anatomic alignment. Total fluoroscopy images: 13 Total fluoroscopy time: 48 seconds Total dose: Radiation Exposure Index (as provided by the fluoroscopic device): 13.95 mGy air Kerma Please see intraoperative findings for further detail. IMPRESSION: Intraoperative fluoroscopy provided for left femoral neck fracture fixation. Electronically Signed   By: Yvonne Kendall M.D.   On: 09/26/2022 12:28   DG C-Arm 1-60 Min-No Report  Result Date:  09/26/2022 Fluoroscopy was utilized by the requesting physician.  No radiographic interpretation.    Disposition:      Follow-up Information     Duanne Guess, PA-C Follow up in 2 week(s).   Specialties: Orthopedic Surgery, Emergency Medicine Contact information: [place] [address] [phone]                  Signed: Feliberto Gottron 09/27/2022, 8:17 AM

## 2022-09-27 NOTE — Discharge Instructions (Signed)
Instructions after Hip Pinning for Impacted femoral neck fracture        Dr. Serita Butcher., M.D.      Dept. of [facility]  [address], [place]  [address]  Phone: [phone]   Fax: [phone]    DIET: Drink plenty of non-alcoholic fluids. Resume your normal diet. Include foods high in fiber.  ACTIVITY:  Use a walker and only place 50% of your body weight on your left leg for the first 2 weeks postop Continue doing gentle exercises. Exercising will reduce the pain and swelling, increase motion, and prevent muscle weakness.   Please continue to use the TED compression stockings for 2 weeks. You may remove the stockings at night, but should reapply them in the morning. Do not drive or operate any equipment until instructed.  WOUND CARE:  Continue to use ice packs periodically to reduce pain and swelling. You may shower with honeycomb dressing 3 days after your surgery. Do not submerge incision site under water. Remove honeycomb dressing 7 days after surgery and allow dermabond to fall off on its own.   MEDICATIONS: You may resume your regular medications. Please take the pain medication as prescribed on the medication list. Do not take pain medication on an empty stomach. You have been given a prescription for a blood thinner to prevent blood clots. Please take the medication as instructed. (NOTE: After completing a 2 week course of Lovenox, take one Enteric-coated 81 mg aspirin twice a day.) Pain medications and iron supplements can cause constipation. Use a stool softener (Senokot or Colace) on a daily basis and a laxative (dulcolax or miralax) as needed. Do not drive or drink alcoholic beverages when taking pain medications.  CALL THE OFFICE FOR: Temperature above 101 degrees Excessive bleeding or drainage on the dressing. Excessive swelling, coldness, or paleness of the toes. Persistent nausea and  vomiting.  FOLLOW-UP:  You should have an appointment to return to the office in 2 weeks after surgery. Arrangements have been made for continuation of Physical Therapy (either home therapy or outpatient therapy).

## 2022-09-27 NOTE — Progress Notes (Signed)
Met with the patient and his wife in the room He has a rolling walker and a transport chair at home, he does not want a 3 in1 He has transportation with his wife [facility] is set up for [facility]

## 2023-06-06 ENCOUNTER — Other Ambulatory Visit: Payer: Self-pay | Admitting: Internal Medicine

## 2023-06-06 DIAGNOSIS — R1011 Right upper quadrant pain: Secondary | ICD-10-CM

## 2023-06-08 ENCOUNTER — Ambulatory Visit
Admission: RE | Admit: 2023-06-08 | Discharge: 2023-06-08 | Disposition: A | Discharge status: Home / Self Care | Payer: Medicare Other | Source: Ambulatory Visit | Attending: Internal Medicine | Admitting: Internal Medicine

## 2023-06-08 DIAGNOSIS — R1011 Right upper quadrant pain: Secondary | ICD-10-CM | POA: Insufficient documentation

## 2023-11-06 ENCOUNTER — Ambulatory Visit: Payer: Medicare Other

## 2023-11-06 DIAGNOSIS — K640 First degree hemorrhoids: Secondary | ICD-10-CM | POA: Diagnosis not present

## 2023-11-06 DIAGNOSIS — Z1211 Encounter for screening for malignant neoplasm of colon: Secondary | ICD-10-CM | POA: Diagnosis present

## 2023-11-06 DIAGNOSIS — K5730 Diverticulosis of large intestine without perforation or abscess without bleeding: Secondary | ICD-10-CM | POA: Diagnosis not present

## 2023-11-06 DIAGNOSIS — Z860101 Personal history of adenomatous and serrated colon polyps: Secondary | ICD-10-CM | POA: Diagnosis not present

## 2023-11-06 DIAGNOSIS — D128 Benign neoplasm of rectum: Secondary | ICD-10-CM | POA: Diagnosis not present

## 2024-11-27 ENCOUNTER — Other Ambulatory Visit: Payer: Self-pay | Admitting: Orthopedic Surgery

## 2024-11-27 DIAGNOSIS — M5416 Radiculopathy, lumbar region: Secondary | ICD-10-CM

## 2024-11-28 ENCOUNTER — Other Ambulatory Visit

## 2024-11-28 ENCOUNTER — Ambulatory Visit
Admission: RE | Admit: 2024-11-28 | Discharge: 2024-11-28 | Disposition: A | Source: Ambulatory Visit | Attending: Orthopedic Surgery

## 2024-11-28 DIAGNOSIS — M5416 Radiculopathy, lumbar region: Secondary | ICD-10-CM | POA: Diagnosis present
